# Patient Record
Sex: Female | Born: 1970 | Race: White | Hispanic: No | Marital: Married | Smoking: Never smoker
Health system: Southern US, Community
[De-identification: ages and names within clinical notes are randomized; demographics above are authoritative.]

## PROBLEM LIST (undated history)

## (undated) DIAGNOSIS — F419 Anxiety disorder, unspecified: Secondary | ICD-10-CM

## (undated) DIAGNOSIS — T7840XA Allergy, unspecified, initial encounter: Secondary | ICD-10-CM

## (undated) HISTORY — PX: COLONOSCOPY: SHX174

## (undated) HISTORY — DX: Anxiety disorder, unspecified: F41.9

## (undated) HISTORY — DX: Allergy, unspecified, initial encounter: T78.40XA

---

## 1989-01-02 HISTORY — PX: FOOT SURGERY: SHX648

## 2004-01-03 HISTORY — PX: OTHER SURGICAL HISTORY: SHX169

## 2014-01-02 DIAGNOSIS — F1910 Other psychoactive substance abuse, uncomplicated: Secondary | ICD-10-CM

## 2014-01-02 HISTORY — DX: Other psychoactive substance abuse, uncomplicated: F19.10

## 2019-09-16 ENCOUNTER — Encounter: Payer: Self-pay | Admitting: Gastroenterology

## 2019-11-19 ENCOUNTER — Encounter: Payer: Self-pay | Admitting: Gastroenterology

## 2020-04-27 ENCOUNTER — Encounter: Payer: Self-pay | Admitting: Gastroenterology

## 2020-06-24 ENCOUNTER — Ambulatory Visit (AMBULATORY_SURGERY_CENTER): Payer: Managed Care, Other (non HMO)

## 2020-06-24 ENCOUNTER — Other Ambulatory Visit: Payer: Self-pay

## 2020-06-24 VITALS — Ht 64.0 in | Wt 151.0 lb

## 2020-06-24 DIAGNOSIS — Z1211 Encounter for screening for malignant neoplasm of colon: Secondary | ICD-10-CM

## 2020-06-24 DIAGNOSIS — F1910 Other psychoactive substance abuse, uncomplicated: Secondary | ICD-10-CM | POA: Insufficient documentation

## 2020-06-24 MED ORDER — PEG 3350-KCL-NA BICARB-NACL 420 G PO SOLR: 4000.0000 mL | Freq: Once | ORAL | 0 refills | Status: AC

## 2020-06-24 NOTE — Progress Notes (Signed)

## 2020-07-07 ENCOUNTER — Encounter: Payer: Self-pay | Admitting: Gastroenterology

## 2020-07-08 ENCOUNTER — Encounter: Payer: Self-pay | Admitting: Gastroenterology

## 2020-07-08 ENCOUNTER — Other Ambulatory Visit: Payer: Self-pay

## 2020-07-08 ENCOUNTER — Ambulatory Visit (AMBULATORY_SURGERY_CENTER): Payer: Managed Care, Other (non HMO) | Admitting: Gastroenterology

## 2020-07-08 VITALS — BP 114/85 | HR 51 | Temp 98.9°F | Resp 13 | Ht 64.0 in | Wt 151.0 lb

## 2020-07-08 DIAGNOSIS — D123 Benign neoplasm of transverse colon: Secondary | ICD-10-CM | POA: Diagnosis not present

## 2020-07-08 DIAGNOSIS — D125 Benign neoplasm of sigmoid colon: Secondary | ICD-10-CM

## 2020-07-08 DIAGNOSIS — Z1211 Encounter for screening for malignant neoplasm of colon: Secondary | ICD-10-CM

## 2020-07-08 DIAGNOSIS — D122 Benign neoplasm of ascending colon: Secondary | ICD-10-CM

## 2020-07-08 MED ORDER — SODIUM CHLORIDE 0.9 % IV SOLN: 500.0000 mL | Freq: Once | INTRAVENOUS | Status: AC

## 2020-07-08 NOTE — Progress Notes (Signed)
Called to room to assist during endoscopic procedure.  Patient ID and intended procedure confirmed with present staff. Received instructions for my participation in the procedure from the performing physician.  

## 2020-07-08 NOTE — Progress Notes (Signed)
VS-CW  Pt's states no medical or surgical changes since previsit or office visit.  

## 2020-07-08 NOTE — Op Note (Signed)
[facility] Patient Name: Sheena Gonzales Procedure Date: 07/08/2020 8:26 AM MRN: [id number] Endoscopist: Thornton Park MD, MD Age: 51 Referring MD:  Date of Birth: [birth year] Gender: Female Account #: [id number] Procedure:                Colonoscopy Indications:              Screening for colorectal malignant neoplasm                           No known family history of colon cancer or polyps Medicines:                Monitored Anesthesia Care Procedure:                Pre-Anesthesia Assessment:                           - Prior to the procedure, a History and Physical                            was performed, and patient medications and                            allergies were reviewed. The patient's tolerance of                            previous anesthesia was also reviewed. The risks                            and benefits of the procedure and the sedation                            options and risks were discussed with the patient.                            All questions were answered, and informed consent                            was obtained. Prior Anticoagulants: The patient has                            taken no previous anticoagulant or antiplatelet                            agents. ASA Grade Assessment: II - A patient with                            mild systemic disease. After reviewing the risks                            and benefits, the patient was deemed in                            satisfactory condition to undergo the procedure.  After obtaining informed consent, the colonoscope                            was passed under direct vision. Throughout the                            procedure, the patient's blood pressure, pulse, and                            oxygen saturations were monitored continuously. The                            CF [id number] #[id number] was introduced through the anus                            and advanced  to the 3 cm into the ileum. A second                            forward view of the right colon was performed. The                            colonoscopy was performed with moderate difficulty                            due to a tortuous colon. Successful completion of                            the procedure was aided by applying abdominal                            pressure. The patient tolerated the procedure well.                            The quality of the bowel preparation was good. The                            terminal ileum, ileocecal valve, appendiceal                            orifice, and rectum were photographed. Scope In: 8:36:47 AM Scope Out: 8:75:64 AM Scope Withdrawal Time: 0 hours 10 minutes 40 seconds  Total Procedure Duration: 0 hours 19 minutes 45 seconds  Findings:                 The perianal and digital rectal examinations were                            normal.                           A 3 mm polyp was found in the sigmoid colon. The                            polyp was flat.  The polyp was removed with a cold                            snare. Resection and retrieval were complete.                            Estimated blood loss was minimal.                           A 2 mm polyp was found in the ascending colon. The                            polyp was sessile. The polyp was removed with a                            cold snare. Resection and retrieval were complete.                            Estimated blood loss was minimal.                           A 3 mm polyp was found in the transverse colon. The                            polyp was sessile. The polyp was removed with a                            cold snare. Resection and retrieval were complete.                            Estimated blood loss was minimal.                           The exam was otherwise without abnormality on                            direct and retroflexion views. Complications:             No immediate complications. Estimated blood loss:                            Minimal. Estimated Blood Loss:     Estimated blood loss was minimal. Impression:               - One 3 mm polyp in the sigmoid colon, removed with                            a cold snare. Resected and retrieved.                           - One 2 mm polyp in the ascending colon, removed                            with a cold snare. Resected  and retrieved.                           - One 3 mm polyp in the transverse colon, removed                            with a cold snare. Resected and retrieved.                           - The examination was otherwise normal on direct                            and retroflexion views. Recommendation:           - Patient has a contact number available for                            emergencies. The signs and symptoms of potential                            delayed complications were discussed with the                            patient. Return to normal activities tomorrow.                            Written discharge instructions were provided to the                            patient.                           - Resume previous diet.                           - Continue present medications.                           - Await pathology results.                           - Repeat colonoscopy date to be determined after                            pending pathology results are reviewed for                            surveillance.                           - Emerging evidence supports eating a diet of                            fruits, vegetables, grains, calcium, and yogurt                            while reducing red meat  and alcohol may reduce the                            risk of colon cancer.                           - Thank you for allowing me to be involved in your                            colon cancer prevention. Thornton Park MD, MD 07/08/2020 9:00:27  AM This report has been signed electronically.

## 2020-07-08 NOTE — Progress Notes (Signed)
pt tolerated well. VSS. awake and to recovery. Report given to RN.  

## 2020-07-08 NOTE — Patient Instructions (Signed)
Resume previous diet and medications Repeat colonoscopy date to be determined after pathology results are reviewed for surveillance.  YOU HAD AN ENDOSCOPIC PROCEDURE TODAY AT [facility]:   Refer to the procedure report that was given to you for any specific questions about what was found during the examination.  If the procedure report does not answer your questions, please call your gastroenterologist to clarify.  If you requested that your care partner not be given the details of your procedure findings, then the procedure report has been included in a sealed envelope for you to review at your convenience later.  YOU SHOULD EXPECT: Some feelings of bloating in the abdomen. Passage of more gas than usual.  Walking can help get rid of the air that was put into your GI tract during the procedure and reduce the bloating. If you had a lower endoscopy (such as a colonoscopy or flexible sigmoidoscopy) you may notice spotting of blood in your stool or on the toilet paper. If you underwent a bowel prep for your procedure, you may not have a normal bowel movement for a few days.  Please Note:  You might notice some irritation and congestion in your nose or some drainage.  This is from the oxygen used during your procedure.  There is no need for concern and it should clear up in a day or so.  SYMPTOMS TO REPORT IMMEDIATELY:  Following lower endoscopy (colonoscopy or flexible sigmoidoscopy):  Excessive amounts of blood in the stool  Significant tenderness or worsening of abdominal pains  Swelling of the abdomen that is new, acute  Fever of 100F or higher  For urgent or emergent issues, a gastroenterologist can be reached at any hour by calling [phone]. Do not use MyChart messaging for urgent concerns.    DIET:  We do recommend a small meal at first, but then you may proceed to your regular diet.  Drink plenty of fluids but you should avoid alcoholic beverages for 24  hours.  ACTIVITY:  You should plan to take it easy for the rest of today and you should NOT DRIVE or use heavy machinery until tomorrow (because of the sedation medicines used during the test).    FOLLOW UP: Our staff will call the number listed on your records 48-72 hours following your procedure to check on you and address any questions or concerns that you may have regarding the information given to you following your procedure. If we do not reach you, we will leave a message.  We will attempt to reach you two times.  During this call, we will ask if you have developed any symptoms of COVID 19. If you develop any symptoms (ie: fever, flu-like symptoms, shortness of breath, cough etc.) before then, please call [phone].  If you test positive for Covid 19 in the 2 weeks post procedure, please call and report this information to [place].    If any biopsies were taken you will be contacted by phone or by letter within the next 1-3 weeks.  Please call us at [phone] if you have not heard about the biopsies in 3 weeks.    SIGNATURES/CONFIDENTIALITY: You and/or your care partner have signed paperwork which will be entered into your electronic medical record.  These signatures attest to the fact that that the information above on your After Visit Summary has been reviewed and is understood.  Full responsibility of the confidentiality of this discharge information lies with you and/or your care-partner.

## 2020-07-12 ENCOUNTER — Telehealth: Payer: Self-pay

## 2020-07-12 NOTE — Telephone Encounter (Signed)
LVM

## 2020-07-15 ENCOUNTER — Encounter: Payer: Self-pay | Admitting: Gastroenterology

## 2020-09-17 ENCOUNTER — Other Ambulatory Visit: Payer: Self-pay | Admitting: Family Medicine

## 2020-09-17 DIAGNOSIS — Z1231 Encounter for screening mammogram for malignant neoplasm of breast: Secondary | ICD-10-CM

## 2020-11-04 ENCOUNTER — Ambulatory Visit
Admission: RE | Admit: 2020-11-04 | Discharge: 2020-11-04 | Disposition: A | Discharge status: Home / Self Care | Payer: Managed Care, Other (non HMO) | Source: Ambulatory Visit | Attending: Family Medicine | Admitting: Family Medicine

## 2020-11-04 DIAGNOSIS — Z1231 Encounter for screening mammogram for malignant neoplasm of breast: Secondary | ICD-10-CM

## 2021-01-07 NOTE — Progress Notes (Signed)
Sheena Gonzales D.[place] Phone: [phone]   Assessment and Plan:     1. Right knee pain, unspecified chronicity 2. 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  This lesion may be putting pressure on lateral quadriceps tendon and causing patient's discomfort - Recommend starting HEP for quadriceps stretching and strengthening - Use Voltaren gel topically if uncomfortable - Advised patient that we could trial corticosteroid injections directly into lipoma to cause fat atrophy and hopefully shrink the size of lipoma, or we could refer to general surgery for removal of lipoma at any time if patient wishes.  Patient does not wish to proceed with injection or surgical referral at this time, but will contact us if she does in the future  Sports Medicine: Musculoskeletal Ultrasound. Exam: Limited US of right knee Diagnosis: Soft tissue mass  US Findings: Well-circumscribed mass of right knee, superior lateral to patella, superficial, mobile, mixed, heterogeneous  US Impression:  Lipoma   Pertinent previous records reviewed include none   Follow Up: As needed if no improvement or worsening of symptoms.  Patient may also call back if she wishes to have corticosteroid injection or referral to surgery   Subjective:   I, Pincus Badder, am serving as a Education administrator for Doctor [employer]  Chief Complaint: right knee pain cyst  HPI:   01/10/2021 Patient is a 51 year old female complaining of right knee pain cyst. Patient states that she's had it for at least 10 years gets it check periodically, its starting to bother her so she would like to get it checked out, is an avid runner and would like to ram up distance and pace. Does stretch and it hurts to stretch due to cyst    Relevant Historical Information: None  pertinent  Additional pertinent review of systems negative.   Current Outpatient Medications:    buPROPion (WELLBUTRIN XL) 150 MG 24 hr tablet, Take 1 tablet by mouth every morning., Disp: , Rfl:    FLUoxetine (PROZAC) 20 MG capsule, Take 20 mg by mouth daily., Disp: , Rfl:    ibuprofen (ADVIL) 400 MG tablet, Take 400 mg by mouth every 6 (six) hours as needed., Disp: , Rfl:   Current Facility-Administered Medications:    0.9 %  sodium chloride infusion, 500 mL, Intravenous, Once, Thornton Park, MD   Objective:     Vitals:   01/10/21 0751  BP: 122/80  Pulse: 72  SpO2: 97%  Weight: 152 lb (68.9 kg)  Height: 5\' 4"  (1.626 m)      Body mass index is 26.09 kg/m.    Physical Exam:    General:  awake, alert oriented, no acute distress nontoxic Skin: Approximately 3 x 1.5 cm soft, mobile lesion of right knee, superior and lateral to patella without erythema, warmth.  No  rashes Neuro:sensation intact, no deficits, strength 5/5 with no deficits, no atrophy, normal muscle tone Psych: No signs of anxiety, depression or other mood disorder  Knee: No swelling No deformity Neg fluid wave, joint milking ROM Flex 110 , Ext 0  TTP mildly over distal, lateral quad tendon, over lesion site NTTP over the   medial fem condyle, lat fem condyle, patella, plica, patella tendon, tibial tuberostiy, fibular head, posterior fossa, pes anserine bursa, gerdy's tubercle, medial jt line, lateral jt line Neg anterior and posterior drawer Neg lachman Neg sag sign  Negative varus stress Negative valgus stress Negative McMurray Negative [place]  Gait normal    Electronically signed by:  Sheena Gonzales D.[facility] 8:19 AM 01/10/21

## 2021-01-10 ENCOUNTER — Ambulatory Visit: Payer: 59 | Admitting: Sports Medicine

## 2021-01-10 ENCOUNTER — Ambulatory Visit: Payer: Self-pay

## 2021-01-10 ENCOUNTER — Other Ambulatory Visit: Payer: Self-pay

## 2021-01-10 VITALS — BP 122/80 | HR 72 | Ht 64.0 in | Wt 152.0 lb

## 2021-01-10 DIAGNOSIS — D1723 Benign lipomatous neoplasm of skin and subcutaneous tissue of right leg: Secondary | ICD-10-CM | POA: Diagnosis not present

## 2021-01-10 DIAGNOSIS — M25561 Pain in right knee: Secondary | ICD-10-CM

## 2021-01-10 NOTE — Patient Instructions (Addendum)
Good to see you Quad HEP As needed follow up

## 2021-04-04 ENCOUNTER — Ambulatory Visit: Payer: 59 | Admitting: Sports Medicine

## 2021-04-04 VITALS — BP 110/78 | Ht 64.0 in | Wt 150.0 lb

## 2021-04-04 DIAGNOSIS — M9904 Segmental and somatic dysfunction of sacral region: Secondary | ICD-10-CM

## 2021-04-04 DIAGNOSIS — M9905 Segmental and somatic dysfunction of pelvic region: Secondary | ICD-10-CM

## 2021-04-04 DIAGNOSIS — M545 Low back pain, unspecified: Secondary | ICD-10-CM

## 2021-04-04 DIAGNOSIS — M9903 Segmental and somatic dysfunction of lumbar region: Secondary | ICD-10-CM

## 2021-04-04 MED ORDER — MELOXICAM 15 MG PO TABS: 15.0000 mg | ORAL_TABLET | Freq: Every day | ORAL | 0 refills | Status: DC

## 2021-04-04 NOTE — Patient Instructions (Addendum)
Good to see you  ?- Start meloxicam 15 mg daily x2 weeks.  If still having pain after 2 weeks, complete 3rd-week of meloxicam. May use remaining meloxicam as needed once daily for pain control.  Do not to use additional NSAIDs while taking meloxicam.  May use Tylenol [phone] mg 2 to 3 times a day for breakthrough pain. ?2-3 week follow up for repeat OMT ? ?

## 2021-04-04 NOTE — Progress Notes (Signed)
? ? Benito Mccreedy D.Merril Abbe ?[facility] ?[place] ?Phone: [phone] ?  ?Assessment and Plan:   ?  ?1. Acute left-sided low back pain without sciatica ?2. Somatic dysfunction of lumbar region ?3. Somatic dysfunction of pelvic region ?4. Somatic dysfunction of sacral region ?-Acute, uncomplicated, initial sports medicine visit ?- Most consistent with strain of left quadratus lumborum while mountain biking, resulting in somatic dysfunction ?- Start meloxicam 15 mg daily x2 weeks.  If still having pain after 2 weeks, complete 3rd-week of meloxicam. May use remaining meloxicam as needed once daily for pain control.  Do not to use additional NSAIDs while taking meloxicam.  May use Tylenol [phone] mg 2 to 3 times a day for breakthrough pain. ?- Patient elected for initial OMT today.  Tolerated well per note below. ?- Decision today to treat with OMT was based on Physical Exam ? ?After verbal consent patient was treated with HVLA (high velocity low amplitude), ME (muscle energy), FPR (flex positional release), ST (soft tissue), PC/PD (Pelvic Compression/ Pelvic Decompression) techniques in sacrum, lumbar, and pelvic areas. Patient tolerated the procedure well with improvement in symptoms.  Patient educated on potential side effects of soreness and recommended to rest, hydrate, and use Tylenol as needed for pain control.  ?  ?Pertinent previous records reviewed include none ?  ?Follow Up: 2 to 3 weeks for reevaluation.  Could repeat OMT if patient found today beneficial ?  ?Subjective:   ?I, Sheena Gonzales, am serving as a Education administrator for Doctor [employer] ? ?Chief Complaint: low back pain ? ?HPI:  ? ?04/04/21 ?Patient is a 51 year old female complaining of low back pain. Patient states that when she was mountain biking 3 weeks ago she hit a dip hard and felt a little tweak left side  was able to go about daily activities , has been rolling , stretching, has a  Physiological scientist, has been taking ib and that doesn't seem to be helping, pain stays on the left side but does radiate down to her left glute feels like a pinched nerve, but it feels like a different nerve than the sciatic, no numbness but when sitting she does get some tingling  ? ?Relevant Historical Information: None pertinent ? ?Additional pertinent review of systems negative. ? ? ?Current Outpatient Medications:  ?  buPROPion (WELLBUTRIN XL) 150 MG 24 hr tablet, Take 1 tablet by mouth every morning., Disp: , Rfl:  ?  FLUoxetine (PROZAC) 20 MG capsule, Take 20 mg by mouth daily., Disp: , Rfl:  ?  ibuprofen (ADVIL) 400 MG tablet, Take 400 mg by mouth every 6 (six) hours as needed., Disp: , Rfl:  ?  meloxicam (MOBIC) 15 MG tablet, Take 1 tablet (15 mg total) by mouth daily., Disp: 30 tablet, Rfl: 0 ? ?Current Facility-Administered Medications:  ?  0.9 %  sodium chloride infusion, 500 mL, Intravenous, Once, Thornton Park, MD  ? ?Objective:   ?  ?Vitals:  ? 04/04/21 1450  ?BP: 110/78  ?Weight: 150 lb (68 kg)  ?Height: '5\' 4"'$  (1.626 m)  ?  ?  ?Body mass index is 25.75 kg/m?.  ?  ?Physical Exam:   ? ?General: Well-appearing, cooperative, sitting comfortably in no acute distress.  ? ?OMT Physical Exam: ? ?ASIS Compression Test: Positive Right ?Sacrum: TTP left sacral base.  Positive sphinx ?Lumbar: TTP paraspinal, L2 RRSR, L5 rotated left ?Pelvis: Right anterior innominate ? ?Gen: Appears well, nad, nontoxic and pleasant ?Psych: Alert and  oriented, appropriate mood and affect ?Neuro: sensation intact, strength is 5/5 in upper and lower extremities, muscle tone wnl ?Skin: no susupicious lesions or rashes ? ?Back - Normal skin, Spine with normal alignment and no deformity.   ?No tenderness to vertebral process palpation.   ?Paraspinous muscles are mildly tender on left L2-L4 and without spasm ?TTP along left flank ?Straight leg raise negative ?Trendelenberg negative  ? ? ?Electronically signed by:  ?Benito Mccreedy D.Merril Abbe ?[facility] ?3:32 PM 04/04/21             ?

## 2021-04-15 NOTE — Progress Notes (Signed)
? Sheena Gonzales ?[facility] ?[place] ?Phone: [phone] ?  ?Assessment and Plan:   ?  ?1. Acute left-sided low back pain without sciatica ?2. Chronic bilateral thoracic back pain ?3. Somatic dysfunction of thoracic region ?4. Somatic dysfunction of lumbar region ?5. Somatic dysfunction of pelvic region ?6. Somatic dysfunction of rib region ?7. Somatic dysfunction of sacral region ?-Chronic with exacerbation, subsequent visit ?- Recurrence of multiple chronic musculoskeletal complaints with most prominent being in thoracic spine.  Overall significant relief of pain and acute left lumbar strain after conservative therapy with 2-week course of meloxicam, relative rest, HEP ?- Discontinue daily meloxicam and use remainder as needed ?- Start HEP for thoracic region ?- Patient has received significant relief with OMT in the past.  Elects for repeat OMT today.  Tolerated well per note below. ?- Decision today to treat with OMT was based on Physical Exam ?  ?After verbal consent patient was treated with HVLA (high velocity low amplitude), ME (muscle energy), FPR (flex positional release), ST (soft tissue), PC/PD (Pelvic Compression/ Pelvic Decompression) techniques in sacrum, rib, thoracic, lumbar, and pelvic areas. Patient tolerated the procedure well with improvement in symptoms.  Patient educated on potential side effects of soreness and recommended to rest, hydrate, and use Tylenol as needed for pain control. ?  ?Pertinent previous records reviewed include none ?  ?Follow Up: 2 to 3 weeks for repeat OMT maintenance.  Goal of spacing out treatments to 4 to 6 weeks if tolerated ?  ?Subjective:   ?I, Pincus Badder, am serving as a Education administrator for Doctor [employer] ?  ?Chief Complaint: low back pain ?  ?HPI:  ?  ?04/04/21 ?Patient is a 51 year old female complaining of low back pain. Patient states that when she was mountain biking 3 weeks ago she hit a dip hard and  felt a little tweak left side  was able to go about daily activities , has been rolling , stretching, has a Physiological scientist, has been taking ib and that doesn't seem to be helping, pain stays on the left side but does radiate down to her left glute feels like a pinched nerve, but it feels like a different nerve than the sciatic, no numbness but when sitting she does get some tingling  ? ?04/18/2021 ?Patient states that she's doing good her back feels better, hip is a little sore from riding her bike on the trails yesterday ? ? ?Relevant Historical Information: None pertinent ? ?Additional pertinent review of systems negative. ? ?Current Outpatient Medications  ?Medication Sig Dispense Refill  ? buPROPion (WELLBUTRIN XL) 150 MG 24 hr tablet Take 1 tablet by mouth every morning.    ? FLUoxetine (PROZAC) 20 MG capsule Take 20 mg by mouth daily.    ? ibuprofen (ADVIL) 400 MG tablet Take 400 mg by mouth every 6 (six) hours as needed.    ? meloxicam (MOBIC) 15 MG tablet Take 1 tablet (15 mg total) by mouth daily. 30 tablet 0  ? ?Current Facility-Administered Medications  ?Medication Dose Route Frequency Provider Last Rate Last Admin  ? 0.9 %  sodium chloride infusion  500 mL Intravenous Once Thornton Park, MD      ?  ?  ?Objective:   ?  ?Vitals:  ? 04/18/21 0828  ?BP: 110/78  ?Pulse: (!) 52  ?SpO2: 96%  ?Weight: 150 lb (68 kg)  ?Height: '5\' 4"'$  (1.626 m)  ?  ?  ?Body mass index  is 25.75 kg/m?.  ?  ?Physical Exam:   ?  ?General: Well-appearing, cooperative, sitting comfortably in no acute distress.  ? ?OMT Physical Exam: ? ?ASIS Compression Test: Positive Right ?Sacrum: NTTP bilateral sacral base.  Negative sphinx ?Rib: Right ribs 3-6 stuck in inhalation ?Thoracic: TTP paraspinal, T3-6 RRSL ?Lumbar: TTP paraspinal, L1-3 RRSL ?Pelvis: Right anterior innominate with out flare ? ?Electronically signed by:  ?Sheena Gonzales ?[facility] ?8:55 AM 04/18/21 ?

## 2021-04-18 ENCOUNTER — Ambulatory Visit: Payer: 59 | Admitting: Sports Medicine

## 2021-04-18 VITALS — BP 110/78 | HR 52 | Ht 64.0 in | Wt 150.0 lb

## 2021-04-18 DIAGNOSIS — G8929 Other chronic pain: Secondary | ICD-10-CM

## 2021-04-18 DIAGNOSIS — M9902 Segmental and somatic dysfunction of thoracic region: Secondary | ICD-10-CM

## 2021-04-18 DIAGNOSIS — M546 Pain in thoracic spine: Secondary | ICD-10-CM | POA: Diagnosis not present

## 2021-04-18 DIAGNOSIS — M9908 Segmental and somatic dysfunction of rib cage: Secondary | ICD-10-CM

## 2021-04-18 DIAGNOSIS — M9904 Segmental and somatic dysfunction of sacral region: Secondary | ICD-10-CM

## 2021-04-18 DIAGNOSIS — M9903 Segmental and somatic dysfunction of lumbar region: Secondary | ICD-10-CM | POA: Diagnosis not present

## 2021-04-18 DIAGNOSIS — M545 Low back pain, unspecified: Secondary | ICD-10-CM | POA: Diagnosis not present

## 2021-04-18 DIAGNOSIS — M9905 Segmental and somatic dysfunction of pelvic region: Secondary | ICD-10-CM | POA: Diagnosis not present

## 2021-04-18 NOTE — Patient Instructions (Addendum)
Good to see you  ?2-3 week OMT follow up  ?

## 2021-05-01 ENCOUNTER — Other Ambulatory Visit: Payer: Self-pay | Admitting: Sports Medicine
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
## 2021-05-06 ENCOUNTER — Ambulatory Visit: Payer: 59 | Admitting: Sports Medicine

## 2021-05-06 VITALS — BP 120/80 | HR 57 | Ht 64.0 in | Wt 150.0 lb

## 2021-05-06 DIAGNOSIS — M9903 Segmental and somatic dysfunction of lumbar region: Secondary | ICD-10-CM

## 2021-05-06 DIAGNOSIS — G8929 Other chronic pain: Secondary | ICD-10-CM | POA: Diagnosis not present

## 2021-05-06 DIAGNOSIS — M9901 Segmental and somatic dysfunction of cervical region: Secondary | ICD-10-CM

## 2021-05-06 DIAGNOSIS — M9905 Segmental and somatic dysfunction of pelvic region: Secondary | ICD-10-CM

## 2021-05-06 DIAGNOSIS — M9902 Segmental and somatic dysfunction of thoracic region: Secondary | ICD-10-CM | POA: Diagnosis not present

## 2021-05-06 DIAGNOSIS — M546 Pain in thoracic spine: Secondary | ICD-10-CM | POA: Diagnosis not present

## 2021-05-06 DIAGNOSIS — M9908 Segmental and somatic dysfunction of rib cage: Secondary | ICD-10-CM

## 2021-05-06 NOTE — Patient Instructions (Addendum)
Good to see you  ?Shoulder HEP  ?Follow up next week to discuss shoulder ?

## 2021-05-12 NOTE — Progress Notes (Signed)
? ? Sheena Gonzales D.Merril Abbe ?[facility] ?[place] ?Phone: [phone] ?  ?Assessment and Plan:   ?  ?1. Acute pain of left shoulder ?2. Arthralgia of left acromioclavicular joint ?-Acute, uncomplicated, initial sports medicine visit ?- Consistent with flare of pain at [facility] joint likely due to physical activity versus side sleeping that has not improved with relative rest and NSAIDs p.o. ?- Patient elected for [facility] CSI.  Tolerated well per note below ?- May restart upper extremity physical activity in 2 to 3 days as tolerated.  Advised to gradually increase from 50% to 75% to 100% with goal of pain-free activity ? ?Sports Medicine: Musculoskeletal Ultrasound. ?Procedure: Ultrasound Guided Acromioclavicular Joint Injection/Aspiration ?Side: Left ?Diagnosis: AC joint pain ?[place] Indication:  ?- accuracy is paramount for diagnosis ?- to ensure therapeutic efficacy or procedural success ?- to reduce procedural risk ? ?After explaining the procedure, viable alternatives, risks, and answering any questions, consent was given verbally. The site was cleaned with chlorhexidine prep. An ultrasound transducer was placed on the shoulder.  The acromioclavicular joint was identified.  The capsule is intact.   A steroid injection was performed under ultrasound guidance with sterile technique using  1 ml of 1% lidocaine without epinephrine and 40 mg of triamcinolone (KENALOG) '40mg'$ /ml. This was well tolerated and resulted in relief.  Needle was removed and dressing placed and post injection instructions were given including  a discussion of likely return of pain today after the anesthetic wears off (with the possibility of worsened pain) until the steroid starts to work in 1-3 days.   Pt was advised to call or return to clinic if these symptoms worsen or fail to improve as anticipated. ? ?  ?Pertinent previous records reviewed include none ?  ?Follow Up: 3 to 4 weeks for reevaluation.   Could consider ultrasound versus PT if no improvement or worsening of symptoms.  Could also repeat OMT at that time ?  ?Subjective:   ?I, Sheena Gonzales, am serving as a Education administrator for Doctor [employer] ? ?Chief Complaint: left shoulder pain  ? ?HPI:  ?05/13/2021 ?Patient is a 51 year old female complaining of left  shoulder pain. Patient states that she is TTP and when driving with one arm she can feel it the most , has been going on for about 4 weeks no MOI, no numbness or tingling, pain on top of shoulder no radiating pain, has been taking meloxicam has been guarding denies any popping locking or clicking  ? ? ?Relevant Historical Information: None pertinent ? ?Additional pertinent review of systems negative. ? ? ?Current Outpatient Medications:  ?  buPROPion (WELLBUTRIN XL) 150 MG 24 hr tablet, Take 1 tablet by mouth every morning., Disp: , Rfl:  ?  FLUoxetine (PROZAC) 20 MG capsule, Take 20 mg by mouth daily., Disp: , Rfl:  ?  ibuprofen (ADVIL) 400 MG tablet, Take 400 mg by mouth every 6 (six) hours as needed., Disp: , Rfl:  ?  meloxicam (MOBIC) 15 MG tablet, Take 1 tablet (15 mg total) by mouth daily., Disp: 30 tablet, Rfl: 0 ? ?Current Facility-Administered Medications:  ?  0.9 %  sodium chloride infusion, 500 mL, Intravenous, Once, Thornton Park, MD  ? ?Objective:   ?  ?Vitals:  ? 05/13/21 0812  ?BP: 110/78  ?Pulse: (!) 53  ?SpO2: 99%  ?Weight: 150 lb (68 kg)  ?Height: '5\' 4"'$  (1.626 m)  ?  ?  ?Body mass index is 25.75 kg/m?Marland Kitchen  ?  ?  Physical Exam:   ? ?Gen: Appears well, nad, nontoxic and pleasant ?Neuro:sensation intact, strength is 5/5 with df/pf/inv/ev, muscle tone wnl ?Skin: no suspicious lesion or defmority ?Psych: A&O, appropriate mood and affect ? ?Left shoulder: no deformity, swelling or muscle wasting ?No scapular winging ?FF 180, abd 180, int 0, ext 90 ?TTP AC ?NTTP over the [place], clavicle,  coracoid, biceps groove, humerus, deltoid, trapezius, cervical spine ?Neg neer, hawkings, empty can, subscap  liftoff, speeds, obriens,   ?Positive crossarm ?Neg ant drawer, sulcus sign, apprehension ?Negative Spurling's test bilat ?FROM of neck  ? ? ?Electronically signed by:  ?Sheena Gonzales D.Merril Abbe ?[facility] ?8:56 AM 05/13/21 ?

## 2021-05-13 ENCOUNTER — Ambulatory Visit: Payer: Self-pay

## 2021-05-13 ENCOUNTER — Ambulatory Visit: Payer: 59 | Admitting: Sports Medicine

## 2021-05-13 ENCOUNTER — Ambulatory Visit (INDEPENDENT_AMBULATORY_CARE_PROVIDER_SITE_OTHER): Payer: 59

## 2021-05-13 VITALS — BP 110/78 | HR 53 | Ht 64.0 in | Wt 150.0 lb

## 2021-05-13 DIAGNOSIS — M25512 Pain in left shoulder: Secondary | ICD-10-CM

## 2021-05-13 NOTE — Patient Instructions (Addendum)
Good to see you ?Recommend starting upper extremity activities  in 2-3 days  ?Slowly progress from 50% to 75% to 100% ?3 week follow up  ?

## 2021-05-16 ENCOUNTER — Ambulatory Visit (INDEPENDENT_AMBULATORY_CARE_PROVIDER_SITE_OTHER): Payer: 59

## 2021-05-16 ENCOUNTER — Ambulatory Visit: Payer: Self-pay

## 2021-05-16 ENCOUNTER — Ambulatory Visit: Payer: 59 | Admitting: Sports Medicine

## 2021-05-16 VITALS — BP 110/78 | Ht 64.0 in | Wt 149.0 lb

## 2021-05-16 DIAGNOSIS — M25511 Pain in right shoulder: Secondary | ICD-10-CM

## 2021-05-16 DIAGNOSIS — M7541 Impingement syndrome of right shoulder: Secondary | ICD-10-CM
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
## 2021-05-16 NOTE — Patient Instructions (Addendum)
Good to see you  ?Shoulder HEP ?Restart - Start meloxicam 15 mg daily x2 weeks.    Do not to use additional NSAIDs while taking meloxicam.  May use Tylenol [phone] mg 2 to 3 times a day for breakthrough pain. ?If you run out call us and we can refill ?Keep your follow up appointment  ?
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
## 2021-06-03 ENCOUNTER — Ambulatory Visit: Payer: 59 | Admitting: Sports Medicine

## 2021-06-03 VITALS — BP 110/80 | HR 61 | Ht 64.0 in | Wt 149.0 lb

## 2021-06-03 DIAGNOSIS — M7541 Impingement syndrome of right shoulder: Secondary | ICD-10-CM | POA: Diagnosis not present

## 2021-06-03 DIAGNOSIS — M25511 Pain in right shoulder: Secondary | ICD-10-CM

## 2021-06-03 MED ORDER — MELOXICAM 15 MG PO TABS
15.0000 mg | ORAL_TABLET | Freq: Every day | ORAL | 0 refills | Status: DC
Start: 2021-06-03 — End: 2022-02-15

## 2021-06-03 NOTE — Patient Instructions (Signed)
Good to see you   

## 2021-07-05 ENCOUNTER — Other Ambulatory Visit: Payer: Self-pay | Admitting: Sports Medicine
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
## 2021-07-14 ENCOUNTER — Ambulatory Visit: Payer: 59 | Admitting: Sports Medicine

## 2021-07-14 VITALS — BP 120/80 | Ht 64.0 in | Wt 150.0 lb

## 2021-07-14 DIAGNOSIS — M25512 Pain in left shoulder: Secondary | ICD-10-CM

## 2021-07-14 DIAGNOSIS — G8929 Other chronic pain: Secondary | ICD-10-CM

## 2021-07-14 NOTE — Patient Instructions (Addendum)
Good to see you  Mri referral  Avoid exercises like push up  - Start meloxicam 15 mg daily x2 weeks.  If still having pain after 2 weeks, complete 3rd-week of meloxicam. May use remaining meloxicam as needed once daily for pain control.  Do not to use additional NSAIDs while taking meloxicam.  May use Tylenol [phone] mg 2 to 3 times a day for breakthrough pain. Follow up 3 days after MRI to discuss results

## 2021-07-24 ENCOUNTER — Ambulatory Visit
Admission: RE | Admit: 2021-07-24 | Discharge: 2021-07-24 | Disposition: A | Discharge status: Home / Self Care | Payer: 59 | Source: Ambulatory Visit | Attending: Sports Medicine | Admitting: Sports Medicine

## 2021-07-24 DIAGNOSIS — G8929 Other chronic pain: Secondary | ICD-10-CM

## 2021-07-24 DIAGNOSIS — M25512 Pain in left shoulder: Secondary | ICD-10-CM
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
## 2021-07-27 ENCOUNTER — Ambulatory Visit (INDEPENDENT_AMBULATORY_CARE_PROVIDER_SITE_OTHER): Payer: 59 | Admitting: Sports Medicine

## 2021-07-27 DIAGNOSIS — M25512 Pain in left shoulder: Secondary | ICD-10-CM | POA: Diagnosis not present

## 2021-07-27 DIAGNOSIS — G8929 Other chronic pain: Secondary | ICD-10-CM

## 2021-07-27 DIAGNOSIS — M7552 Bursitis of left shoulder: Secondary | ICD-10-CM | POA: Diagnosis not present

## 2021-07-27 DIAGNOSIS — M75112 Incomplete rotator cuff tear or rupture of left shoulder, not specified as traumatic: Secondary | ICD-10-CM
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
## 2021-08-23 ENCOUNTER — Ambulatory Visit: Payer: 59 | Admitting: Sports Medicine

## 2021-08-23 VITALS — BP 118/80 | HR 57 | Ht 64.0 in | Wt 151.0 lb

## 2021-08-23 DIAGNOSIS — M7552 Bursitis of left shoulder: Secondary | ICD-10-CM

## 2021-08-23 DIAGNOSIS — G8929 Other chronic pain: Secondary | ICD-10-CM | POA: Diagnosis not present

## 2021-08-23 DIAGNOSIS — M75112 Incomplete rotator cuff tear or rupture of left shoulder, not specified as traumatic: Secondary | ICD-10-CM

## 2021-08-23 DIAGNOSIS — M25512 Pain in left shoulder: Secondary | ICD-10-CM | POA: Diagnosis not present

## 2021-08-23 NOTE — Patient Instructions (Addendum)
Good to see you   PT referral  1 month follow up

## 2021-09-12 NOTE — Therapy (Unsigned)
OUTPATIENT PHYSICAL THERAPY SHOULDER EVALUATION   Patient Name: Sheena Gonzales MRN: [id number] DOB:08-26-1970, 51 y.o., female Today's Date: 09/13/2021   PT End of Session - 09/13/21 0856     Visit Number 1    Number of Visits 12    Date for PT Re-Evaluation 11/08/21    Authorization Type UHC    PT Start Time 0845    PT Stop Time 0930    PT Time Calculation (min) 45 min    Activity Tolerance Patient tolerated treatment well    Behavior During Therapy [facility] for tasks assessed/performed             Past Medical History:  Diagnosis Date   Allergy    Anxiety    Substance abuse ([place]) 2016   AA   Past Surgical History:  Procedure Laterality Date   ablation  2006   uterine   COLONOSCOPY     FOOT SURGERY Right 1991   Patient Active Problem List   Diagnosis Date Noted   Substance abuse ([place]) 06/24/2020    PCP: unknown  REFERRING PROVIDER: Glennon Mac, DO  REFERRING DIAG: [phone] (ICD-10-CM) - Nontraumatic incomplete tear of left rotator cuff M75.52 (ICD-10-CM) - Subacromial bursitis of left shoulder joint M25.512,G89.29 (ICD-10-CM) - Chronic left shoulder pain      THERAPY DIAG:  Chronic left shoulder pain  Abnormal posture  Rationale for Evaluation and Treatment Rehabilitation  ONSET DATE: chronic , <1 yr   SUBJECTIVE:                                                                                                                                                                                      SUBJECTIVE STATEMENT: Pt went in for assessment of a L shoulder "nodule" on the clavicle.   She had an injection which helped only briefly.  She had another injection.  She is active, biking, weights and Production designer, theatre/television/film. She still has difficulty with lifting bike up onto the rack, doing her normal fitness routine.  She has pain with lying on her L side. She notices a weakness when driving, turning the wheel.  She would like to be able to have full function of her L shoulder.    PERTINENT HISTORY: None relevant    PAIN:  Are you having pain? Yes: NPRS scale: minimal /10 Pain location: L shoulder  Pain description: uncomfortable, tender when palpated   Aggravating factors: extremes of ROM, lifting , push ups  Relieving factors: rest , non use   PRECAUTIONS: None  WEIGHT BEARING RESTRICTIONS No  FALLS:  Has patient fallen in last 6 months? 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  Strength , avoid surgery.   OBJECTIVE:   DIAGNOSTIC FINDINGS:  IMPRESSION: 1. Moderate rotator cuff tendinopathy/tendinosis most notably involving the supraspinatus tendon. There are partial-thickness articular and bursal surface tears as detailed above. No full-thickness retracted tear. 2. Intact long head biceps tendon and glenoid labrum. 3. Moderate AC joint degenerative changes but no other significant findings for bony impingement. 4. Mild subacromial/subdeltoid bursitis.      PATIENT SURVEYS:  FOTO 59% goal is 73 %   COGNITION:  Overall cognitive status: Within functional limits for tasks assessed     SENSATION: WFL  POSTURE: Slightly rounded shoulders with glenohumeral head more anterior L shoulder with goal post position on the wall.  Scapula abducted from midline on L side. Bilateral she has anterior seated humeral head.   UPPER EXTREMITY ROM:      Rt UE was more stiff than Lt. She fell on Rt UE awhile ago but no pain in Rt UE   Active ROM Right eval Left eval  Shoulder flexion WFL WFL, 160 no pain   Shoulder extension    Shoulder abduction WFL WFL,  160 no pain   Shoulder adduction    Shoulder internal rotation FR to T8 FR to T6  Shoulder external rotation FR  to T1 FR to T2  Elbow flexion    Elbow extension    Wrist flexion    Wrist extension    Wrist ulnar deviation    Wrist radial deviation    Wrist pronation    Wrist supination    (Blank rows = not tested)  UPPER EXTREMITY MMT:  MMT Right eval Left eval  Shoulder flexion 5 5  Shoulder extension    Shoulder abduction 5 5  Shoulder adduction    Shoulder internal rotation 5 5  Shoulder external rotation 5 4+  Middle trapezius 4+ 4  Lower trapezius    Elbow flexion    Elbow extension    Wrist flexion    Wrist extension    Wrist ulnar deviation    Wrist radial deviation    Wrist pronation    Wrist supination    Grip strength (lbs) 56 lbs 57.6 lbs  (Blank rows = not tested)  SHOULDER SPECIAL TESTS:  Impingement tests: Hawkins/Kennedy impingement test: negative and Painful arc test: negative  SLAP lesions:  NT  Instability tests: Apprehension test: negative   JOINT MOBILITY TESTING:  No stiffness in joint Post capsule tight L> R   PALPATION:  TTP   TODAY'S TREATMENT:  PT eval and HEP initiated   PATIENT EDUCATION: Education details: HEP, eval findings, PT process Person educated: Patient Education method: Explanation, Demonstration, Verbal cues, and Handouts Education comprehension: verbalized understanding and needs further education   HOME EXERCISE PROGRAM: Access Code: [email or web address] URL: [email or web address] Date: 09/13/2021 Prepared by: Raeford Razor  Exercises - Sleeper Stretch  - 1 x daily - 7 x weekly - 1 sets - 10 reps - 15 hold - Shoulder External Rotation and Scapular Retraction with Resistance  - 2 x daily - 7 x weekly - 3 sets - 10 reps - Standing Low Trap Setting with Resistance at [place] 2 x daily - 7 x weekly - 2 sets - 10 reps -  5 hold - Seated High Shoulder Row with Anchored Resistance  - 2 x daily - 7 x weekly - 2 sets - 10 reps - 5 hold  ASSESSMENT:  CLINICAL IMPRESSION: Patient is a 51 y.o. female who was seen today for  physical therapy evaluation and treatment for chronic L shoulder pain due to    OBJECTIVE IMPAIRMENTS decreased strength, increased fascial restrictions, impaired flexibility, impaired UE functional use, postural dysfunction, and pain.   ACTIVITY LIMITATIONS carrying, lifting, sleeping, and reach over head  PARTICIPATION LIMITATIONS: community activity and occupation  PERSONAL FACTORS Time since onset of injury/illness/exacerbation are also affecting patient's functional outcome.   REHAB POTENTIAL: Excellent  CLINICAL DECISION MAKING: Stable/uncomplicated  EVALUATION COMPLEXITY: Low   GOALS: Goals reviewed with patient? Yes  SHORT TERM GOALS: Target date: 10/11/2021  (Remove Blue Hyperlink)  Pt will be I with HEP for bilateral UE strength and mobility  Baseline:given on eval  Goal status: INITIAL  2.  Pt will be able to sleep/lie on L shoulder without disturbance of pain for short periods of time  Baseline:  Goal status: INITIAL  3.  Pt will lift light items (< 25 lbs) overhead (mimicking bike lift) with no pain to impact long term UE function  Baseline:  Goal status: INITIAL    LONG TERM GOALS: Target date: 11/08/2021  (Remove Blue Hyperlink)  Pt will be able to improve FOTO score to 70% or better  Baseline: 59% Goal status: INITIAL  2.  Pt will be able to lift bike onto her rack with confidence and no pain increase > 75% of the time  Baseline:  Goal status: INITIAL  3.  Pt will be able to perform a modified push up (on knees) without increased shoulder pain  Baseline: popping, pain with full push up  Goal status: INITIAL  4.  Pt will be I with final HEP upon discharge  Baseline:  Goal status: INITIAL    PLAN: PT FREQUENCY: 2x/week x 3 weeks then 1 x for 3 more weeks   PT DURATION: 6 weeks   PLANNED INTERVENTIONS: Therapeutic exercises, Therapeutic activity, Neuromuscular re-education, Balance training, Gait training, Patient/Family education, Self Care,  Joint mobilization, Dry Needling, Cryotherapy, Moist heat, Taping, Ultrasound, Ionotophoresis '4mg'$ /ml Dexamethasone, Manual therapy, and Re-evaluation  PLAN FOR NEXT SESSION: check HEP, UBE, scapular stab.  Check scap in closed chain. Modalities as needed    Rishit Burkhalter, PT 09/13/2021, 10:48 AM  Raeford Razor, PT 09/13/21 12:07 PM Phone: [phone] Fax: [phone]

## 2021-09-13 ENCOUNTER — Ambulatory Visit: Payer: 59 | Attending: Family Medicine | Admitting: Physical Therapy

## 2021-09-13 DIAGNOSIS — G8929 Other chronic pain: Secondary | ICD-10-CM | POA: Diagnosis present

## 2021-09-13 DIAGNOSIS — M25512 Pain in left shoulder: Secondary | ICD-10-CM | POA: Diagnosis present

## 2021-09-13 DIAGNOSIS — R293 Abnormal posture: Secondary | ICD-10-CM | POA: Insufficient documentation

## 2021-09-13 DIAGNOSIS — M75112 Incomplete rotator cuff tear or rupture of left shoulder, not specified as traumatic: Secondary | ICD-10-CM | POA: Insufficient documentation

## 2021-09-13 DIAGNOSIS — M7552 Bursitis of left shoulder: Secondary | ICD-10-CM | POA: Diagnosis not present
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
## 2021-09-20 ENCOUNTER — Ambulatory Visit: Payer: 59 | Admitting: Physical Therapy

## 2021-09-20 ENCOUNTER — Encounter: Payer: Self-pay | Admitting: Physical Therapy

## 2021-09-20 DIAGNOSIS — G8929 Other chronic pain: Secondary | ICD-10-CM

## 2021-09-20 DIAGNOSIS — M25512 Pain in left shoulder: Secondary | ICD-10-CM | POA: Diagnosis not present

## 2021-09-20 DIAGNOSIS — R293 Abnormal posture: Secondary | ICD-10-CM
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
## 2021-09-23 ENCOUNTER — Ambulatory Visit: Payer: 59 | Admitting: Physical Therapy

## 2021-09-23 ENCOUNTER — Encounter: Payer: Self-pay | Admitting: Physical Therapy

## 2021-09-23 ENCOUNTER — Ambulatory Visit: Payer: 59 | Admitting: Sports Medicine

## 2021-09-23 VITALS — BP 122/80 | HR 65 | Ht 64.0 in | Wt 145.0 lb

## 2021-09-23 DIAGNOSIS — M25512 Pain in left shoulder: Secondary | ICD-10-CM | POA: Diagnosis not present

## 2021-09-23 DIAGNOSIS — G8929 Other chronic pain: Secondary | ICD-10-CM

## 2021-09-23 DIAGNOSIS — M7552 Bursitis of left shoulder: Secondary | ICD-10-CM

## 2021-09-23 DIAGNOSIS — M75112 Incomplete rotator cuff tear or rupture of left shoulder, not specified as traumatic: Secondary | ICD-10-CM

## 2021-09-23 DIAGNOSIS — R293 Abnormal posture: Secondary | ICD-10-CM

## 2021-09-23 NOTE — Patient Instructions (Signed)
Good to see you   

## 2021-09-23 NOTE — Therapy (Unsigned)
OUTPATIENT PHYSICAL THERAPY TREATMENT NOTE   Patient Name: Sheena Gonzales MRN: [id number] DOB:June 04, 1970, 51 y.o., female Today's Date: 09/26/2021  PCP/REFERRING PROVIDER: Rachell Cipro, MD  END OF SESSION:   PT End of Session - 09/26/21 0848     Visit Number 4    Number of Visits 12    Date for PT Re-Evaluation 11/08/21    Authorization Type U[employer]    PT Start Time [phone]    PT Stop Time 0930    PT Time Calculation (min) 44 min    Activity Tolerance Patient tolerated treatment well    Behavior During Therapy [facility] for tasks assessed/performed              Past Medical History:  Diagnosis Date   Allergy    Anxiety    Substance abuse ([place]) 2016   AA   Past Surgical History:  Procedure Laterality Date   ablation  2006   uterine   COLONOSCOPY     FOOT SURGERY Right 1991   Patient Active Problem List   Diagnosis Date Noted   Substance abuse ([place]) 06/24/2020    REFERRING DIAG: Shoulder pain   THERAPY DIAG:  Chronic left shoulder pain  Abnormal posture  Rationale for Evaluation and Treatment Rehabilitation  PERTINENT HISTORY: see above   PRECAUTIONS: none   SUBJECTIVE: I was really sore yesterday. Used ice and that really helped.   PAIN:  Are you having pain? Yes: NPRS scale: 0/10 Pain location: L shoulder  Pain description: sore Aggravating factors: reaching back  Relieving factors: rest  Pain mostly when reaching back  OBJECTIVE: (objective measures completed at initial evaluation unless otherwise dated)  DIAGNOSTIC FINDINGS:  IMPRESSION: 1. Moderate rotator cuff tendinopathy/tendinosis most notably involving the supraspinatus tendon. There are partial-thickness articular and bursal surface tears as detailed above. No full-thickness retracted tear. 2. Intact long head biceps tendon and glenoid labrum. 3. Moderate AC joint degenerative changes but no other significant findings for bony impingement. 4. Mild subacromial/subdeltoid bursitis.        PATIENT SURVEYS:  FOTO 59% goal is 73 %    COGNITION:           Overall cognitive status: Within functional limits for tasks assessed                                  SENSATION: WFL   POSTURE: Slightly rounded shoulders with glenohumeral head more anterior L shoulder with goal post position on the wall.  Scapula abducted from midline on L side. Bilateral she has anterior seated humeral head.    UPPER EXTREMITY ROM:                           Rt UE was more stiff than Lt. She fell on Rt UE awhile ago but no pain in Rt UE     Active ROM Right eval Left eval  Shoulder flexion [facility] WFL, 160 no pain   Shoulder extension      Shoulder abduction WFL WFL,  160 no pain   Shoulder adduction      Shoulder internal rotation FR to T8 FR to T6  Shoulder external rotation FR to T1 FR to T2  Elbow flexion      Elbow extension      Wrist flexion      Wrist extension      Wrist ulnar deviation  Wrist radial deviation      Wrist pronation      Wrist supination      (Blank rows = not tested)   UPPER EXTREMITY MMT:   MMT Right eval Left eval  Shoulder flexion 5 5  Shoulder extension      Shoulder abduction 5 5  Shoulder adduction      Shoulder internal rotation 5 5  Shoulder external rotation 5 4+  Middle trapezius 4+ 4  Lower trapezius      Elbow flexion      Elbow extension      Wrist flexion      Wrist extension      Wrist ulnar deviation      Wrist radial deviation      Wrist pronation      Wrist supination      Grip strength (lbs) 56 lbs 57.6 lbs  (Blank rows = not tested)   SHOULDER SPECIAL TESTS:            Impingement tests: Hawkins/Kennedy impingement test: negative and Painful arc test: negative            SLAP lesions:  NT            Instability tests: Apprehension test: negative             JOINT MOBILITY TESTING:  No stiffness in joint Post capsule tight L> R       [facility] Adult PT Treatment:                                                DATE:  09/26/21 Therapeutic Exercise: Foam roller on the wall: overhead mobility, star pattern and horizontal pull with green band x 20 reps each Looped band overhead lift x 10 then chest press (scapular stabilizers) x 10  Facing wall, rolling up and down for lat release and serratus   Reformer Quadruped 1 red press out to plank x 10 , min cues for alignment in head neck and shoulders  Row 1 red roll down  x 10  Horizontal abd 1 red  x 10  ER/IR 1 red spring x 15  Supine arm arcs 1 red 1 yellow x 10 in parallel and then in abduction              [facility] Adult PT Treatment:                                                DATE: 09/23/21 Therapeutic Exercise: UBE level 1 , 5 min (2.5 min each direction)  Standing bands (green and black) -progressed to Free motion 20# low row 15 x 2  Free motion ext 14# bil shoulder ext  15 x 2  Free Motion high row 6# 15 x 2  Standing horizontal abduction with star Pattern 7 reps x 2  Bilat shoulder ER green band x 30, x 25 Sidelying post delt fly x 15 , 4 lbs  Sidelying ER , 4 lbs x 15  Sidelying abduction x 15 , 4 lbs  Sleeper stretch each side 10 sec x 5  POE plank 10 sec    [facility] Adult PT Treatment:  DATE: 09/20/21 Therapeutic Exercise: UBE level 1 , 5 min (2.5 min each direction)  Standing bands (green and black)  ER each side x 10 High row x 20  Horiztonal abd green x 15 Scarecrow with ER x 15  Green band ER walk outs  Sleeper stretch each side 10 sec x 5  Sidelying post delt fly x 15 , 4 lbs  Sidelying ER , 4 lbs x 15  Sidelying abduction x 15 , 4 lbs  Manual Therapy: L shoulder PROM, soft tissue to L supraspinatus, upper trap, levator scap and rhomboid, ant/mid delt.    PATIENT EDUCATION: Education details: HEP, eval findings, PT process Person educated: Patient Education method: Explanation, Demonstration, Verbal cues, and Handouts Education comprehension: verbalized understanding and needs further  education     HOME EXERCISE PROGRAM: Access Code: [email or web address] URL: [email or web address] Date: 09/20/2021 Prepared by: Raeford Razor  Exercises - Sleeper Stretch  - 1 x daily - 7 x weekly - 1 sets - 10 reps - 15 hold - Shoulder External Rotation and Scapular Retraction with Resistance  - 2 x daily - 7 x weekly - 3 sets - 10 reps - Standing Low Trap Setting with Resistance at [place]  - 2 x daily - 7 x weekly - 2 sets - 10 reps - 5 hold - Seated High Shoulder Row with Anchored Resistance  - 2 x daily - 7 x weekly - 2 sets - 10 reps - 5 hold - Sidelying Shoulder ER with Towel and Dumbbell  - 1 x daily - 7 x weekly - 2 sets - 10 reps - 5 hold - Sidelying Shoulder Abduction Palm Forward  - 1 x daily - 7 x weekly - 2 sets - 10 reps - 30 hold  ASSESSMENT:   CLINICAL IMPRESSION: Patient fatigued after session but no increase in shoulder pain .  Used Pilates Reformer for more full body strength and postural work. Weakness evident with core and seated UE work, thoracic extension. Pt may be interested in Pilates post PT.     OBJECTIVE IMPAIRMENTS decreased strength, increased fascial restrictions, impaired flexibility, impaired UE functional use, postural dysfunction, and pain.    ACTIVITY LIMITATIONS carrying, lifting, sleeping, and reach over head   PARTICIPATION LIMITATIONS: community activity and occupation   PERSONAL FACTORS Time since onset of injury/illness/exacerbation are also affecting patient's functional outcome.    REHAB POTENTIAL: Excellent   CLINICAL DECISION MAKING: Stable/uncomplicated   EVALUATION COMPLEXITY: Low     GOALS: Goals reviewed with patient? Yes   SHORT TERM GOALS: Target date: 10/11/2021  (Remove Blue Hyperlink)   Pt will be I with HEP for bilateral UE strength and mobility  Baseline:given on eval  Goal status: met    2.  Pt will be able to sleep/lie on L shoulder without disturbance of pain for short periods of time  Baseline:  Status:  09/23/21: pain has not been waking her this week Goal status: met   3.  Pt will lift light items (< 25 lbs) overhead (mimicking bike lift) with no pain to impact long term UE function  Baseline:  Goal status: ONGOING       LONG TERM GOALS: Target date: 11/08/2021  (Remove Blue Hyperlink)   Pt will be able to improve FOTO score to 70% or better  Baseline: 59% Goal status: INITIAL   2.  Pt will be able to lift bike onto her rack with confidence and no pain increase > 75% of the time  Baseline:  Goal status: INITIAL   3.  Pt will be able to perform a modified push up (on knees) without increased shoulder pain  Baseline: popping, pain with full push up  Goal status: INITIAL   4.  Pt will be I with final HEP upon discharge  Baseline:  Goal status: INITIAL       PLAN: PT FREQUENCY: 2x/week x 3 weeks then 1 x for 3 more weeks    PT DURATION: 6 weeks    PLANNED INTERVENTIONS: Therapeutic exercises, Therapeutic activity, Neuromuscular re-education, Balance training, Gait training, Patient/Family education, Self Care, Joint mobilization, Dry Needling, Cryotherapy, Moist heat, Taping, Ultrasound, Ionotophoresis 62m/ml Dexamethasone, Manual therapy, and Re-evaluation   PLAN FOR NEXT SESSION: check HEP, UBE, scapular stab.  Check scap in closed chain. Modalities as needed    JRaeford Razor PT 09/26/21 1:53 PM Phone: [phone]: [phone]
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
## 2021-09-26 ENCOUNTER — Ambulatory Visit: Payer: 59 | Admitting: Physical Therapy

## 2021-09-26 DIAGNOSIS — R293 Abnormal posture: Secondary | ICD-10-CM

## 2021-09-26 DIAGNOSIS — G8929 Other chronic pain: Secondary | ICD-10-CM

## 2021-09-26 DIAGNOSIS — M25512 Pain in left shoulder: Secondary | ICD-10-CM | POA: Diagnosis not present

## 2021-09-28 ENCOUNTER — Encounter: Payer: Self-pay | Admitting: Physical Therapy

## 2021-09-28 ENCOUNTER — Ambulatory Visit: Payer: 59 | Admitting: Physical Therapy

## 2021-09-28 DIAGNOSIS — R293 Abnormal posture: Secondary | ICD-10-CM

## 2021-09-28 DIAGNOSIS — G8929 Other chronic pain: Secondary | ICD-10-CM

## 2021-09-28 DIAGNOSIS — M25512 Pain in left shoulder: Secondary | ICD-10-CM | POA: Diagnosis not present

## 2021-09-28 NOTE — Therapy (Signed)
OUTPATIENT PHYSICAL THERAPY TREATMENT NOTE   Patient Name: Sheena Gonzales MRN: [id number] DOB:05-01-1970, 51 y.o., female Today's Date: 09/28/2021  PCP/REFERRING PROVIDER: Rachell Cipro, MD  END OF SESSION:   PT End of Session - 09/28/21 0853     Visit Number 5    Number of Visits 12    Date for PT Re-Evaluation 11/08/21    Authorization Type UHC    PT Start Time 0845    PT Stop Time 0930    PT Time Calculation (min) 45 min    Activity Tolerance Patient tolerated treatment well    Behavior During Therapy [facility] for tasks assessed/performed               Past Medical History:  Diagnosis Date   Allergy    Anxiety    Substance abuse ([place]) 2016   AA   Past Surgical History:  Procedure Laterality Date   ablation  2006   uterine   COLONOSCOPY     FOOT SURGERY Right 1991   Patient Active Problem List   Diagnosis Date Noted   Substance abuse ([place]) 06/24/2020    REFERRING DIAG: Shoulder pain   THERAPY DIAG:  Chronic left shoulder pain  Abnormal posture  Rationale for Evaluation and Treatment Rehabilitation  PERTINENT HISTORY: see above   PRECAUTIONS: none   SUBJECTIVE: I was really sore yesterday. Used ice and that really helped.   PAIN:  Are you having pain? Yes: NPRS scale: 0/10 Pain location: L shoulder  Pain description: sore Aggravating factors: reaching back  Relieving factors: rest  Pain mostly when reaching back  OBJECTIVE: (objective measures completed at initial evaluation unless otherwise dated)  DIAGNOSTIC FINDINGS:  IMPRESSION: 1. Moderate rotator cuff tendinopathy/tendinosis most notably involving the supraspinatus tendon. There are partial-thickness articular and bursal surface tears as detailed above. No full-thickness retracted tear. 2. Intact long head biceps tendon and glenoid labrum. 3. Moderate AC joint degenerative changes but no other significant findings for bony impingement. 4. Mild subacromial/subdeltoid bursitis.        PATIENT SURVEYS:  FOTO 59% goal is 73 %    COGNITION:           Overall cognitive status: Within functional limits for tasks assessed                                  SENSATION: WFL   POSTURE: Slightly rounded shoulders with glenohumeral head more anterior L shoulder with goal post position on the wall.  Scapula abducted from midline on L side. Bilateral she has anterior seated humeral head.    UPPER EXTREMITY ROM:                           Rt UE was more stiff than Lt. She fell on Rt UE awhile ago but no pain in Rt UE     Active ROM Right eval Left eval  Shoulder flexion [facility] WFL, 160 no pain   Shoulder extension      Shoulder abduction WFL WFL,  160 no pain   Shoulder adduction      Shoulder internal rotation FR to T8 FR to T6  Shoulder external rotation FR to T1 FR to T2  Elbow flexion      Elbow extension      Wrist flexion      Wrist extension      Wrist ulnar deviation  Wrist radial deviation      Wrist pronation      Wrist supination      (Blank rows = not tested)   UPPER EXTREMITY MMT:   MMT Right eval Left eval  Shoulder flexion 5 5  Shoulder extension      Shoulder abduction 5 5  Shoulder adduction      Shoulder internal rotation 5 5  Shoulder external rotation 5 4+  Middle trapezius 4+ 4  Lower trapezius      Elbow flexion      Elbow extension      Wrist flexion      Wrist extension      Wrist ulnar deviation      Wrist radial deviation      Wrist pronation      Wrist supination      Grip strength (lbs) 56 lbs 57.6 lbs  (Blank rows = not tested)   SHOULDER SPECIAL TESTS:            Impingement tests: Hawkins/Kennedy impingement test: negative and Painful arc test: negative            SLAP lesions:  NT            Instability tests: Apprehension test: negative             JOINT MOBILITY TESTING:  No stiffness in joint Post capsule tight L> R        [facility] Adult PT Treatment:                                                DATE:  09/28/21 Therapeutic Exercise: UBE 5 min , 2.5 min each direction  Foam roller thoracic extension Foam roller core work, stabilization  Alternating arm flexion/extension, horizontal pull green band, diagonal pull green band, green looped band overhead lift Dead bug variations Sidelying post delt fly x 15 , 4 lbs , 2 sets Sidelying ER , 4 lbs x 15 , 2 sets  Sidelying abduction x 15 , 4 lbs  2 sets  Quadruped 3 lbs  shoulder flexion with opp leg extended each side x 10 small ROM  Bent over row 10 lbs knee on table x 10, then 15 lbs x 10 LUE only      [facility] Adult PT Treatment:                                                DATE: 09/26/21 Therapeutic Exercise: Foam roller on the wall: overhead mobility, star pattern and horizontal pull with green band x 20 reps each Looped band overhead lift x 10 then chest press (scapular stabilizers) x 10  Facing wall, rolling up and down for lat release and serratus   Reformer Quadruped 1 red press out to plank x 10 , min cues for alignment in head neck and shoulders  Row 1 red roll down  x 10  Horizontal abd 1 red  x 10  ER/IR 1 red spring x 15  Supine arm arcs 1 red 1 yellow x 10 in parallel and then in abduction              [facility] Adult PT Treatment:  DATE: 09/23/21 Therapeutic Exercise: UBE level 1 , 5 min (2.5 min each direction)  Standing bands (green and black) -progressed to Free motion 20# low row 15 x 2  Free motion ext 14# bil shoulder ext  15 x 2  Free Motion high row 6# 15 x 2  Standing horizontal abduction with star Pattern 7 reps x 2  Bilat shoulder ER green band x 30, x 25 Sidelying post delt fly x 15 , 4 lbs  Sidelying ER , 4 lbs x 15  Sidelying abduction x 15 , 4 lbs  Sleeper stretch each side 10 sec x 5  POE plank 10 sec      PATIENT EDUCATION: Education details: HEP, eval findings, PT process Person educated: Patient Education method: Explanation, Demonstration, Verbal cues,  and Handouts Education comprehension: verbalized understanding and needs further education     HOME EXERCISE PROGRAM: Access Code: [email or web address] URL: [email or web address] Date: 09/20/2021 Prepared by: Raeford Razor  Exercises - Sleeper Stretch  - 1 x daily - 7 x weekly - 1 sets - 10 reps - 15 hold - Shoulder External Rotation and Scapular Retraction with Resistance  - 2 x daily - 7 x weekly - 3 sets - 10 reps - Standing Low Trap Setting with Resistance at [place]  - 2 x daily - 7 x weekly - 2 sets - 10 reps - 5 hold - Seated High Shoulder Row with Anchored Resistance  - 2 x daily - 7 x weekly - 2 sets - 10 reps - 5 hold - Sidelying Shoulder ER with Towel and Dumbbell  - 1 x daily - 7 x weekly - 2 sets - 10 reps - 5 hold - Sidelying Shoulder Abduction Palm Forward  - 1 x daily - 7 x weekly - 2 sets - 10 reps - 30 hold  ASSESSMENT:   CLINICAL IMPRESSION: Patient tolerated some challenging shoulder work quite well. Used the foam roller for core stability during shoulder strengthening to release paraspinals as well.  She is benefiting from skilled PT to improve shoulder performance, mechanics and overall postural alignment     OBJECTIVE IMPAIRMENTS decreased strength, increased fascial restrictions, impaired flexibility, impaired UE functional use, postural dysfunction, and pain.    ACTIVITY LIMITATIONS carrying, lifting, sleeping, and reach over head   PARTICIPATION LIMITATIONS: community activity and occupation   PERSONAL FACTORS Time since onset of injury/illness/exacerbation are also affecting patient's functional outcome.    REHAB POTENTIAL: Excellent   CLINICAL DECISION MAKING: Stable/uncomplicated   EVALUATION COMPLEXITY: Low     GOALS: Goals reviewed with patient? Yes   SHORT TERM GOALS: Target date: 10/11/2021  (Remove Blue Hyperlink)   Pt will be I with HEP for bilateral UE strength and mobility  Baseline:given on eval  Goal status: met    2.  Pt will be  able to sleep/lie on L shoulder without disturbance of pain for short periods of time  Baseline:  Status: 09/23/21: pain has not been waking her this week Goal status: met   3.  Pt will lift light items (< 25 lbs) overhead (mimicking bike lift) with no pain to impact long term UE function  Baseline:  Goal status: ONGOING       LONG TERM GOALS: Target date: 11/08/2021  (Remove Blue Hyperlink)   Pt will be able to improve FOTO score to 70% or better  Baseline: 59% Goal status: INITIAL   2.  Pt will be able to lift bike onto her rack with confidence and no  pain increase > 75% of the time  Baseline:  Goal status: INITIAL   3.  Pt will be able to perform a modified push up (on knees) without increased shoulder pain  Baseline: popping, pain with full push up  Goal status: INITIAL   4.  Pt will be I with final HEP upon discharge  Baseline:  Goal status: INITIAL       PLAN: PT FREQUENCY: 2x/week x 3 weeks then 1 x for 3 more weeks    PT DURATION: 6 weeks    PLANNED INTERVENTIONS: Therapeutic exercises, Therapeutic activity, Neuromuscular re-education, Balance training, Gait training, Patient/Family education, Self Care, Joint mobilization, Dry Needling, Cryotherapy, Moist heat, Taping, Ultrasound, Ionotophoresis 64m/ml Dexamethasone, Manual therapy, and Re-evaluation   PLAN FOR NEXT SESSION: check HEP, UBE, scapular stab.  Check scap in closed chain. Modalities as needed   Try some load carries, lifting overhead?    JRaeford Razor PT 09/28/21 11:18 AM Phone: [phone]: [phone]

## 2021-10-03 ENCOUNTER — Ambulatory Visit: Payer: 59 | Admitting: Physical Therapy

## 2021-10-05 ENCOUNTER — Ambulatory Visit: Payer: 59 | Attending: Family Medicine | Admitting: Physical Therapy

## 2021-10-05 ENCOUNTER — Encounter: Payer: Self-pay | Admitting: Physical Therapy

## 2021-10-05 DIAGNOSIS — R293 Abnormal posture: Secondary | ICD-10-CM | POA: Insufficient documentation

## 2021-10-05 DIAGNOSIS — M25512 Pain in left shoulder: Secondary | ICD-10-CM | POA: Insufficient documentation

## 2021-10-05 DIAGNOSIS — G8929 Other chronic pain: Secondary | ICD-10-CM | POA: Insufficient documentation

## 2021-10-05 NOTE — Therapy (Signed)
OUTPATIENT PHYSICAL THERAPY TREATMENT NOTE   Patient Name: Sheena Gonzales MRN: [id number] DOB:1970/12/04, 51 y.o., female Today's Date: 10/05/2021  PCP/REFERRING PROVIDER: Rachell Cipro, MD  END OF SESSION:   PT End of Session - 10/05/21 0851     Visit Number 6    Number of Visits 12    Date for PT Re-Evaluation 11/08/21    Authorization Type UHC    PT Start Time [phone]    PT Stop Time 0930    PT Time Calculation (min) 44 min    Activity Tolerance Patient tolerated treatment well    Behavior During Therapy [facility] for tasks assessed/performed                Past Medical History:  Diagnosis Date   Allergy    Anxiety    Substance abuse ([place]) 2016   AA   Past Surgical History:  Procedure Laterality Date   ablation  2006   uterine   COLONOSCOPY     FOOT SURGERY Right 1991   Patient Active Problem List   Diagnosis Date Noted   Substance abuse ([place]) 06/24/2020    REFERRING DIAG: Shoulder pain   THERAPY DIAG:  Chronic left shoulder pain  Abnormal posture  Rationale for Evaluation and Treatment Rehabilitation  PERTINENT HISTORY: see above   PRECAUTIONS: none   SUBJECTIVE: No problems with shoulder on vacation.  No pain right now.   PAIN:  Are you having pain? Yes: NPRS scale: 0/10 Pain location: L shoulder  Pain description: sore Aggravating factors: reaching back  Relieving factors: rest  Pain mostly when reaching back  OBJECTIVE: (objective measures completed at initial evaluation unless otherwise dated)  DIAGNOSTIC FINDINGS:  IMPRESSION: 1. Moderate rotator cuff tendinopathy/tendinosis most notably involving the supraspinatus tendon. There are partial-thickness articular and bursal surface tears as detailed above. No full-thickness retracted tear. 2. Intact long head biceps tendon and glenoid labrum. 3. Moderate AC joint degenerative changes but no other significant findings for bony impingement. 4. Mild subacromial/subdeltoid bursitis.        PATIENT SURVEYS:  FOTO 59% goal is 73 %    COGNITION:           Overall cognitive status: Within functional limits for tasks assessed                                  SENSATION: WFL   POSTURE: Slightly rounded shoulders with glenohumeral head more anterior L shoulder with goal post position on the wall.  Scapula abducted from midline on L side. Bilateral she has anterior seated humeral head.    UPPER EXTREMITY ROM:                           Rt UE was more stiff than Lt. She fell on Rt UE awhile ago but no pain in Rt UE     Active ROM Right eval Left eval  Shoulder flexion [facility] WFL, 160 no pain   Shoulder extension      Shoulder abduction WFL WFL,  160 no pain   Shoulder adduction      Shoulder internal rotation FR to T8 FR to T6  Shoulder external rotation FR to T1 FR to T2  Elbow flexion      Elbow extension      Wrist flexion      Wrist extension      Wrist ulnar deviation  Wrist radial deviation      Wrist pronation      Wrist supination      (Blank rows = not tested)   UPPER EXTREMITY MMT:   MMT Right eval Left eval  Shoulder flexion 5 5  Shoulder extension      Shoulder abduction 5 5  Shoulder adduction      Shoulder internal rotation 5 5  Shoulder external rotation 5 4+  Middle trapezius 4+ 4  Lower trapezius      Elbow flexion      Elbow extension      Wrist flexion      Wrist extension      Wrist ulnar deviation      Wrist radial deviation      Wrist pronation      Wrist supination      Grip strength (lbs) 56 lbs 57.6 lbs  (Blank rows = not tested)   SHOULDER SPECIAL TESTS:            Impingement tests: Hawkins/Kennedy impingement test: negative and Painful arc test: negative            SLAP lesions:  NT            Instability tests: Apprehension test: negative             JOINT MOBILITY TESTING:  No stiffness in joint Post capsule tight L> R      [facility] Adult PT Treatment:                                                DATE:  10/05/21 Therapeutic Exercise: Pilates Reformer used for LE/core strength, postural strength, lumbopelvic disassociation and core control.  Exercises included: Footwork 2 red 1 blue 1 Yellow while taking subjective and prep for session  Supine Arm work  1 red 1 blue: Arcs in parallel and in T with ball held in table top  Supine Abs   1 red 1 blue upper ab curl x 10 , 2 sets , 2nd set with legs extension     Quadruped  Moving plank on platform x 10 each , 2 sets, 1 red than 1 blue for challenge in core   Reverse Abdominals  1 blue x 10 UE only then added LE x 10   Long box Prone Pulling straps 1 red x 10  Triceps x 10  Overhead press 1 x 15 double arm then x 10 single arm Clicking noted L shoulder (clavicle) full ROM   Weighted carry 5 lbs KB , bell up elbow flexed arm up then arm out to 90 deg abd  Lateral and forward raise 5 lbs x 8 each stand in mirror     [facility] Adult PT Treatment:                                                DATE: 09/28/21 Therapeutic Exercise: UBE 5 min , 2.5 min each direction  Foam roller thoracic extension Foam roller core work, stabilization  Alternating arm flexion/extension, horizontal pull green band, diagonal pull green band, green looped band overhead lift Dead bug variations Sidelying post delt fly x 15 , 4 lbs , 2 sets Sidelying ER , 4  lbs x 15 , 2 sets  Sidelying abduction x 15 , 4 lbs  2 sets  Quadruped 3 lbs  shoulder flexion with opp leg extended each side x 10 small ROM  Bent over row 10 lbs knee on table x 10, then 15 lbs x 10 LUE only      [facility] Adult PT Treatment:                                                DATE: 09/26/21 Therapeutic Exercise: Foam roller on the wall: overhead mobility, star pattern and horizontal pull with green band x 20 reps each Looped band overhead lift x 10 then chest press (scapular stabilizers) x 10  Facing wall, rolling up and down for lat release and serratus   Reformer Quadruped 1 red press out to  plank x 10 , min cues for alignment in head neck and shoulders  Row 1 red roll down  x 10  Horizontal abd 1 red  x 10  ER/IR 1 red spring x 15  Supine arm arcs 1 red 1 yellow x 10 in parallel and then in abduction              [facility] Adult PT Treatment:                                                DATE: 09/23/21 Therapeutic Exercise: UBE level 1 , 5 min (2.5 min each direction)  Standing bands (green and black) -progressed to Free motion 20# low row 15 x 2  Free motion ext 14# bil shoulder ext  15 x 2  Free Motion high row 6# 15 x 2  Standing horizontal abduction with star Pattern 7 reps x 2  Bilat shoulder ER green band x 30, x 25 Sidelying post delt fly x 15 , 4 lbs  Sidelying ER , 4 lbs x 15  Sidelying abduction x 15 , 4 lbs  Sleeper stretch each side 10 sec x 5  POE plank 10 sec      PATIENT EDUCATION: Education details: HEP, eval findings, PT process Person educated: Patient Education method: Explanation, Demonstration, Verbal cues, and Handouts Education comprehension: verbalized understanding and needs further education     HOME EXERCISE PROGRAM: Access Code: [email or web address] URL: [email or web address] Date: 09/20/2021 Prepared by: Raeford Razor  Exercises - Sleeper Stretch  - 1 x daily - 7 x weekly - 1 sets - 10 reps - 15 hold - Shoulder External Rotation and Scapular Retraction with Resistance  - 2 x daily - 7 x weekly - 3 sets - 10 reps - Standing Low Trap Setting with Resistance at [place]  - 2 x daily - 7 x weekly - 2 sets - 10 reps - 5 hold - Seated High Shoulder Row with Anchored Resistance  - 2 x daily - 7 x weekly - 2 sets - 10 reps - 5 hold - Sidelying Shoulder ER with Towel and Dumbbell  - 1 x daily - 7 x weekly - 2 sets - 10 reps - 5 hold - Sidelying Shoulder Abduction Palm Forward  - 1 x daily - 7 x weekly - 2 sets - 10 reps - 30 hold  ASSESSMENT:   CLINICAL  IMPRESSION: Pam continues to do HEP consistently and have min to no pain .  She is dropping  back to 1 x per week here at the clinic.  Fatigues with stabilization exercises with min crepitus with overhead lifting. She will likely need only 2-3 more visits, will increase load and stabilization challenges, including Pilates.    OBJECTIVE IMPAIRMENTS decreased strength, increased fascial restrictions, impaired flexibility, impaired UE functional use, postural dysfunction, and pain.    ACTIVITY LIMITATIONS carrying, lifting, sleeping, and reach over head   PARTICIPATION LIMITATIONS: community activity and occupation   PERSONAL FACTORS Time since onset of injury/illness/exacerbation are also affecting patient's functional outcome.    REHAB POTENTIAL: Excellent   CLINICAL DECISION MAKING: Stable/uncomplicated   EVALUATION COMPLEXITY: Low     GOALS: Goals reviewed with patient? Yes   SHORT TERM GOALS: Target date: 10/11/2021  (Remove Blue Hyperlink)   Pt will be I with HEP for bilateral UE strength and mobility  Baseline:given on eval  Goal status: met    2.  Pt will be able to sleep/lie on L shoulder without disturbance of pain for short periods of time  Baseline:  Status: 09/23/21: pain has not been waking her this week Goal status: met   3.  Pt will lift light items (< 25 lbs) overhead (mimicking bike lift) with no pain to impact long term UE function  Baseline:  Goal status: ONGOING       LONG TERM GOALS: Target date: 11/08/2021  (Remove Blue Hyperlink)   Pt will be able to improve FOTO score to 70% or better  Baseline: 59% Goal status: INITIAL   2.  Pt will be able to lift bike onto her rack with confidence and no pain increase > 75% of the time  Baseline:  Goal status: INITIAL   3.  Pt will be able to perform a modified push up (on knees) without increased shoulder pain  Baseline: popping, pain with full push up  Goal status: INITIAL   4.  Pt will be I with final HEP upon discharge  Baseline:  Goal status: INITIAL       PLAN: PT FREQUENCY: 2x/week x 3  weeks then 1 x for 3 more weeks    PT DURATION: 6 weeks    PLANNED INTERVENTIONS: Therapeutic exercises, Therapeutic activity, Neuromuscular re-education, Balance training, Gait training, Patient/Family education, Self Care, Joint mobilization, Dry Needling, Cryotherapy, Moist heat, Taping, Ultrasound, Ionotophoresis 46m/ml Dexamethasone, Manual therapy, and Re-evaluation   PLAN FOR NEXT SESSION: check GOALS.  Check scap in closed chain. Modalities as needed   Try some load carries, lifting overhead?    JRaeford Razor PT 10/05/21 8:54 AM Phone: [phone]: [phone]

## 2021-10-10 ENCOUNTER — Ambulatory Visit: Payer: 59 | Admitting: Physical Therapy

## 2021-10-10 ENCOUNTER — Encounter: Payer: Self-pay | Admitting: Physical Therapy

## 2021-10-10 DIAGNOSIS — R293 Abnormal posture: Secondary | ICD-10-CM

## 2021-10-10 DIAGNOSIS — M25512 Pain in left shoulder: Secondary | ICD-10-CM | POA: Diagnosis not present

## 2021-10-10 DIAGNOSIS — G8929 Other chronic pain: Secondary | ICD-10-CM

## 2021-10-10 NOTE — Therapy (Signed)
OUTPATIENT PHYSICAL THERAPY TREATMENT NOTE   Patient Name: Sheena Gonzales MRN: [id number] DOB:1970/11/16, 51 y.o., female Today's Date: 10/10/2021  PCP/REFERRING PROVIDER: Rachell Cipro, MD  END OF SESSION:   PT End of Session - 10/10/21 0852     Visit Number 7    Number of Visits 12    Date for PT Re-Evaluation 11/08/21    Authorization Type UHC    PT Start Time 0850    PT Stop Time 0933    PT Time Calculation (min) 43 min                Past Medical History:  Diagnosis Date   Allergy    Anxiety    Substance abuse ([place]) 2016   AA   Past Surgical History:  Procedure Laterality Date   ablation  2006   uterine   COLONOSCOPY     FOOT SURGERY Right 1991   Patient Active Problem List   Diagnosis Date Noted   Substance abuse ([place]) 06/24/2020    REFERRING DIAG: Shoulder pain   THERAPY DIAG:  Chronic left shoulder pain  Abnormal posture  Rationale for Evaluation and Treatment Rehabilitation  PERTINENT HISTORY: see above   PRECAUTIONS: none   SUBJECTIVE:  I can lie on my L shoulder at night without waking up.  I am riding today so that will be revealing.    PAIN:  Are you having pain? Yes: NPRS scale: 0/10 Pain location: L shoulder  Pain description: sore Aggravating factors: reaching back  Relieving factors: rest  Pain mostly when reaching back  OBJECTIVE: (objective measures completed at initial evaluation unless otherwise dated)  DIAGNOSTIC FINDINGS:  IMPRESSION: 1. Moderate rotator cuff tendinopathy/tendinosis most notably involving the supraspinatus tendon. There are partial-thickness articular and bursal surface tears as detailed above. No full-thickness retracted tear. 2. Intact long head biceps tendon and glenoid labrum. 3. Moderate AC joint degenerative changes but no other significant findings for bony impingement. 4. Mild subacromial/subdeltoid bursitis.       PATIENT SURVEYS:  FOTO 59% goal is 73 %  FOTO update: 10/10/21:  72%   COGNITION:           Overall cognitive status: Within functional limits for tasks assessed                                  SENSATION: WFL   POSTURE: Slightly rounded shoulders with glenohumeral head more anterior L shoulder with goal post position on the wall.  Scapula abducted from midline on L side. Bilateral she has anterior seated humeral head.    UPPER EXTREMITY ROM:                           Rt UE was more stiff than Lt. She fell on Rt UE awhile ago but no pain in Rt UE     Active ROM Right eval Left eval  Shoulder flexion [facility] WFL, 160 no pain   Shoulder extension      Shoulder abduction WFL WFL,  160 no pain   Shoulder adduction      Shoulder internal rotation FR to T8 FR to T6  Shoulder external rotation FR to T1 FR to T2  Elbow flexion      Elbow extension      Wrist flexion      Wrist extension      Wrist ulnar deviation  Wrist radial deviation      Wrist pronation      Wrist supination      (Blank rows = not tested)   UPPER EXTREMITY MMT:   MMT Right eval Left eval  Shoulder flexion 5 5  Shoulder extension      Shoulder abduction 5 5  Shoulder adduction      Shoulder internal rotation 5 5  Shoulder external rotation 5 4+  Middle trapezius 4+ 4  Lower trapezius      Elbow flexion      Elbow extension      Wrist flexion      Wrist extension      Wrist ulnar deviation      Wrist radial deviation      Wrist pronation      Wrist supination      Grip strength (lbs) 56 lbs 57.6 lbs  (Blank rows = not tested)   SHOULDER SPECIAL TESTS:            Impingement tests: Hawkins/Kennedy impingement test: negative and Painful arc test: negative            SLAP lesions:  NT            Instability tests: Apprehension test: negative             JOINT MOBILITY TESTING:  No stiffness in joint Post capsule tight L> R  [facility] Adult PT Treatment:                                                DATE: 10/10/21 Therapeutic Exercise: UBE 5 min L2  Blue band  horizontal abd blue and then star patterns x 15 each  ER/IR x 20 blue band  Bicep curls 15 lbs x 1 min Bent over row 15 lbs x 15  Single arm reverse fly red loop x 15  Abduction single arm red loop x 15  Sustained ER with shoulder flexion red loop x 10 (sidestepping)  Blue band ER with shoulder at 90 deg flexion x 10 Blue band ER with abduction 90 deg x 10  Overhead press 10 lbs double arm x 10  Front plank with leg lifts  Mod push up on high mat- pain , also with knees on mat table  Sideplank on L elbow hold then added hip dips x 10 Pilates upstretch 1 red 1 yellow (plank) with press out x 10 no pain .     [facility] Adult PT Treatment:                                                DATE: 10/05/21 Therapeutic Exercise: Pilates Reformer used for LE/core strength, postural strength, lumbopelvic disassociation and core control.  Exercises included: Footwork 2 red 1 blue 1 Yellow while taking subjective and prep for session  Supine Arm work  1 red 1 blue: Arcs in parallel and in T with ball held in table top  Supine Abs   1 red 1 blue upper ab curl x 10 , 2 sets , 2nd set with legs extension     Quadruped  Moving plank on platform x 10 each , 2 sets, 1 red than 1 blue for challenge in  core   Reverse Abdominals  1 blue x 10 UE only then added LE x 10   Long box Prone Pulling straps 1 red x 10  Triceps x 10  Overhead press 1 x 15 double arm then x 10 single arm Clicking noted L shoulder (clavicle) full ROM   Weighted carry 5 lbs KB , bell up elbow flexed arm up then arm out to 90 deg abd  Lateral and forward raise 5 lbs x 8 each stand in mirror     [facility] Adult PT Treatment:                                                DATE: 09/28/21 Therapeutic Exercise: UBE 5 min , 2.5 min each direction  Foam roller thoracic extension Foam roller core work, stabilization  Alternating arm flexion/extension, horizontal pull green band, diagonal pull green band, green looped band overhead  lift Dead bug variations Sidelying post delt fly x 15 , 4 lbs , 2 sets Sidelying ER , 4 lbs x 15 , 2 sets  Sidelying abduction x 15 , 4 lbs  2 sets  Quadruped 3 lbs  shoulder flexion with opp leg extended each side x 10 small ROM  Bent over row 10 lbs knee on table x 10, then 15 lbs x 10 LUE only      [facility] Adult PT Treatment:                                                DATE: 09/26/21 Therapeutic Exercise: Foam roller on the wall: overhead mobility, star pattern and horizontal pull with green band x 20 reps each Looped band overhead lift x 10 then chest press (scapular stabilizers) x 10  Facing wall, rolling up and down for lat release and serratus   Reformer Quadruped 1 red press out to plank x 10 , min cues for alignment in head neck and shoulders  Row 1 red roll down  x 10  Horizontal abd 1 red  x 10  ER/IR 1 red spring x 15  Supine arm arcs 1 red 1 yellow x 10 in parallel and then in abduction              [facility] Adult PT Treatment:                                                DATE: 09/23/21 Therapeutic Exercise: UBE level 1 , 5 min (2.5 min each direction)  Standing bands (green and black) -progressed to Free motion 20# low row 15 x 2  Free motion ext 14# bil shoulder ext  15 x 2  Free Motion high row 6# 15 x 2  Standing horizontal abduction with star Pattern 7 reps x 2  Bilat shoulder ER green band x 30, x 25 Sidelying post delt fly x 15 , 4 lbs  Sidelying ER , 4 lbs x 15  Sidelying abduction x 15 , 4 lbs  Sleeper stretch each side 10 sec x 5  POE plank 10 sec      PATIENT EDUCATION:  Education details: HEP, eval findings, PT process Person educated: Patient Education method: Explanation, Demonstration, Verbal cues, and Handouts Education comprehension: verbalized understanding and needs further education     HOME EXERCISE PROGRAM: Access Code: [email or web address] URL: [email or web address] Date: 09/20/2021 Prepared by: Raeford Razor  Exercises - Sleeper  Stretch  - 1 x daily - 7 x weekly - 1 sets - 10 reps - 15 hold - Shoulder External Rotation and Scapular Retraction with Resistance  - 2 x daily - 7 x weekly - 3 sets - 10 reps - Standing Low Trap Setting with Resistance at [place]  - 2 x daily - 7 x weekly - 2 sets - 10 reps - 5 hold - Seated High Shoulder Row with Anchored Resistance  - 2 x daily - 7 x weekly - 2 sets - 10 reps - 5 hold - Sidelying Shoulder ER with Towel and Dumbbell  - 1 x daily - 7 x weekly - 2 sets - 10 reps - 5 hold - Sidelying Shoulder Abduction Palm Forward  - 1 x daily - 7 x weekly - 2 sets - 10 reps - 30 hold  ASSESSMENT:   CLINICAL IMPRESSION: Sheena Gonzales is showing good progress, meeting goals or FOTO and ability to lie on L shoudler.  She will ride her bike today and need to lift her bike to waist height. She did have some pain in L shoulder (post) with push ups despite modifying. Will cont to work on scapular position and mechanics.  Planks and added challenges do not increase her shoulder pain .   OBJECTIVE IMPAIRMENTS decreased strength, increased fascial restrictions, impaired flexibility, impaired UE functional use, postural dysfunction, and pain.    ACTIVITY LIMITATIONS carrying, lifting, sleeping, and reach over head   PARTICIPATION LIMITATIONS: community activity and occupation   PERSONAL FACTORS Time since onset of injury/illness/exacerbation are also affecting patient's functional outcome.    REHAB POTENTIAL: Excellent   CLINICAL DECISION MAKING: Stable/uncomplicated   EVALUATION COMPLEXITY: Low     GOALS: Goals reviewed with patient? Yes   SHORT TERM GOALS: Target date: 10/11/2021  (Remove Blue Hyperlink)   Pt will be I with HEP for bilateral UE strength and mobility  Baseline:given on eval  Goal status: met    2.  Pt will be able to sleep/lie on L shoulder without disturbance of pain for short periods of time  Baseline:  Status: 09/23/21: pain has not been waking her this week Goal status: met    3.  Pt will lift light items (< 25 lbs) overhead (mimicking bike lift) with no pain to impact long term UE function  Baseline:  Goal status: ONGOING       LONG TERM GOALS: Target date: 11/08/2021  (Remove Blue Hyperlink)   Pt will be able to improve FOTO score to 70% or better  Baseline: 59%, update 72% goal is 73% Goal status:MET   2.  Pt will be able to lift bike onto her rack with confidence and no pain increase > 75% of the time  Baseline: will see today!  Goal status: ongoing   3.  Pt will be able to perform a modified push up (on knees) without increased shoulder pain  Baseline: popping, pain with full push up . Update: pain with 1 push up today with 2 modifcations.  Goal status:ongoing    4.  Pt will be I with final HEP upon discharge  Baseline:  Goal status: ongoing        PLAN: PT FREQUENCY:  2x/week x 3 weeks then 1 x for 3 more weeks    PT DURATION: 6 weeks    PLANNED INTERVENTIONS: Therapeutic exercises, Therapeutic activity, Neuromuscular re-education, Balance training, Gait training, Patient/Family education, Self Care, Joint mobilization, Dry Needling, Cryotherapy, Moist heat, Taping, Ultrasound, Ionotophoresis 15m/ml Dexamethasone, Manual therapy, and Re-evaluation   PLAN FOR NEXT SESSION:  Check scap in closed chain: wall push ups. Sidelying Modalities as needed   Try some load carries, lifting overhead?    JRaeford Razor PT 10/10/21 9:43 AM Phone: [phone]: [phone]

## 2021-10-17 ENCOUNTER — Ambulatory Visit: Payer: 59 | Admitting: Physical Therapy

## 2021-10-17 ENCOUNTER — Encounter: Payer: Self-pay | Admitting: Physical Therapy

## 2021-10-17 DIAGNOSIS — G8929 Other chronic pain: Secondary | ICD-10-CM

## 2021-10-17 DIAGNOSIS — R293 Abnormal posture: Secondary | ICD-10-CM

## 2021-10-17 DIAGNOSIS — M25512 Pain in left shoulder: Secondary | ICD-10-CM | POA: Diagnosis not present

## 2021-10-17 NOTE — Therapy (Signed)
OUTPATIENT PHYSICAL THERAPY TREATMENT NOTE   Patient Name: Sheena Gonzales MRN: [id number] DOB:July 17, 1970, 51 y.o., female Today's Date: 10/17/2021  PCP/REFERRING PROVIDER: Rachell Cipro, MD  END OF SESSION:   PT End of Session - 10/17/21 0854     Visit Number 8    Number of Visits 12    Date for PT Re-Evaluation 11/08/21    Authorization Type UHC    PT Start Time [phone]    PT Stop Time 0930    PT Time Calculation (min) 39 min    Activity Tolerance Patient tolerated treatment well    Behavior During Therapy [facility] for tasks assessed/performed                 Past Medical History:  Diagnosis Date   Allergy    Anxiety    Substance abuse ([place]) 2016   AA   Past Surgical History:  Procedure Laterality Date   ablation  2006   uterine   COLONOSCOPY     FOOT SURGERY Right 1991   Patient Active Problem List   Diagnosis Date Noted   Substance abuse ([place]) 06/24/2020    REFERRING DIAG: Shoulder pain   THERAPY DIAG:  Chronic left shoulder pain  Abnormal posture  Rationale for Evaluation and Treatment Rehabilitation  PERTINENT HISTORY: see above   PRECAUTIONS: none   SUBJECTIVE:  My shoulder is just now getting better.  The push up really got my shoulder messed.   PAIN:  Are you having pain? Yes: NPRS scale: 0/10 Pain location: L shoulder  Pain description: sore Aggravating factors: reaching back  Relieving factors: rest  Pain mostly when reaching back  OBJECTIVE: (objective measures completed at initial evaluation unless otherwise dated)  DIAGNOSTIC FINDINGS:  IMPRESSION: 1. Moderate rotator cuff tendinopathy/tendinosis most notably involving the supraspinatus tendon. There are partial-thickness articular and bursal surface tears as detailed above. No full-thickness retracted tear. 2. Intact long head biceps tendon and glenoid labrum. 3. Moderate AC joint degenerative changes but no other significant findings for bony impingement. 4. Mild  subacromial/subdeltoid bursitis.       PATIENT SURVEYS:  FOTO 59% goal is 73 %  FOTO update: 10/10/21: 72%   COGNITION:           Overall cognitive status: Within functional limits for tasks assessed                                  SENSATION: WFL   POSTURE: Slightly rounded shoulders with glenohumeral head more anterior L shoulder with goal post position on the wall.  Scapula abducted from midline on L side. Bilateral she has anterior seated humeral head.    UPPER EXTREMITY ROM:                           Rt UE was more stiff than Lt. She fell on Rt UE awhile ago but no pain in Rt UE     Active ROM Right eval Left eval  Shoulder flexion [facility] WFL, 160 no pain   Shoulder extension      Shoulder abduction WFL WFL,  160 no pain   Shoulder adduction      Shoulder internal rotation FR to T8 FR to T6  Shoulder external rotation FR to T1 FR to T2  Elbow flexion      Elbow extension      Wrist flexion  Wrist extension      Wrist ulnar deviation      Wrist radial deviation      Wrist pronation      Wrist supination      (Blank rows = not tested)   UPPER EXTREMITY MMT:   MMT Right eval Left eval  Shoulder flexion 5 5  Shoulder extension      Shoulder abduction 5 5  Shoulder adduction      Shoulder internal rotation 5 5  Shoulder external rotation 5 4+  Middle trapezius 4+ 4  Lower trapezius      Elbow flexion      Elbow extension      Wrist flexion      Wrist extension      Wrist ulnar deviation      Wrist radial deviation      Wrist pronation      Wrist supination      Grip strength (lbs) 56 lbs 57.6 lbs  (Blank rows = not tested)   SHOULDER SPECIAL TESTS:            Impingement tests: Hawkins/Kennedy impingement test: negative and Painful arc test: negative            SLAP lesions:  NT            Instability tests: Apprehension test: negative             JOINT MOBILITY TESTING:  No stiffness in joint Post capsule tight L> R   [facility] Adult PT Treatment:                                                 DATE: 10/17/21 Therapeutic Exercise: UBE level 1: 3 min. forward, 3 min. back Supine foam roller core and mobility work: narrow grip overhead lift x 10, horizontal pull x 15  Dead bug variations : 04-19-22 added arms  Single arm stability with 6 lbs DB x 10 unilateral arm pull  Prone scapular protraction/retraction x 10 with foam roller to swan x 8  Sidelying L shoulder scaption 6 lbs then 4 lbs about 8 reps each, propped on Rt elbpw  Sidelying L shoulder ER about 1 min 4 lbs  Sidelying post delt fly 4 lbs x 30 sec  2 rounds of the last 3 exercises   [facility] Adult PT Treatment:                                                DATE: 10/10/21 Therapeutic Exercise: UBE 5 min L2  Blue band horizontal abd blue and then star patterns x 15 each  ER/IR x 20 blue band  Bicep curls 15 lbs x 1 min Bent over row 15 lbs x 15  Single arm reverse fly red loop x 15  Abduction single arm red loop x 15  Sustained ER with shoulder flexion red loop x 10 (sidestepping)  Blue band ER with shoulder at 90 deg flexion x 10 Blue band ER with abduction 90 deg x 10  Overhead press 10 lbs double arm x 10  Front plank with leg lifts  Mod push up on high mat- pain , also with knees on mat table  Sideplank on L elbow  hold then added hip dips x 10 Pilates upstretch 1 red 1 yellow (plank) with press out x 10 no pain .     [facility] Adult PT Treatment:                                                DATE: 10/05/21 Therapeutic Exercise: Pilates Reformer used for LE/core strength, postural strength, lumbopelvic disassociation and core control.  Exercises included: Footwork 2 red 1 blue 1 Yellow while taking subjective and prep for session  Supine Arm work  1 red 1 blue: Arcs in parallel and in T with ball held in table top  Supine Abs   1 red 1 blue upper ab curl x 10 , 2 sets , 2nd set with legs extension     Quadruped  Moving plank on platform x 10 each , 2 sets, 1 red than 1  blue for challenge in core   Reverse Abdominals  1 blue x 10 UE only then added LE x 10   Long box Prone Pulling straps 1 red x 10  Triceps x 10  Overhead press 1 x 15 double arm then x 10 single arm Clicking noted L shoulder (clavicle) full ROM   Weighted carry 5 lbs KB , bell up elbow flexed arm up then arm out to 90 deg abd  Lateral and forward raise 5 lbs x 8 each stand in mirror     [facility] Adult PT Treatment:                                                DATE: 09/28/21 Therapeutic Exercise: UBE 5 min , 2.5 min each direction  Foam roller thoracic extension Foam roller core work, stabilization  Alternating arm flexion/extension, horizontal pull green band, diagonal pull green band, green looped band overhead lift Dead bug variations Sidelying post delt fly x 15 , 4 lbs , 2 sets Sidelying ER , 4 lbs x 15 , 2 sets  Sidelying abduction x 15 , 4 lbs  2 sets  Quadruped 3 lbs  shoulder flexion with opp leg extended each side x 10 small ROM  Bent over row 10 lbs knee on table x 10, then 15 lbs x 10 LUE only      [facility] Adult PT Treatment:                                                DATE: 09/26/21 Therapeutic Exercise: Foam roller on the wall: overhead mobility, star pattern and horizontal pull with green band x 20 reps each Looped band overhead lift x 10 then chest press (scapular stabilizers) x 10  Facing wall, rolling up and down for lat release and serratus   Reformer Quadruped 1 red press out to plank x 10 , min cues for alignment in head neck and shoulders  Row 1 red roll down  x 10  Horizontal abd 1 red  x 10  ER/IR 1 red spring x 15  Supine arm arcs 1 red 1 yellow x 10 in parallel and then in abduction  [place] Adult PT Treatment:                                                DATE: 09/23/21 Therapeutic Exercise: UBE level 1 , 5 min (2.5 min each direction)  Standing bands (green and black) -progressed to Free motion 20# low row 15 x 2  Free motion ext 14#  bil shoulder ext  15 x 2  Free Motion high row 6# 15 x 2  Standing horizontal abduction with star Pattern 7 reps x 2  Bilat shoulder ER green band x 30, x 25 Sidelying post delt fly x 15 , 4 lbs  Sidelying ER , 4 lbs x 15  Sidelying abduction x 15 , 4 lbs  Sleeper stretch each side 10 sec x 5  POE plank 10 sec      PATIENT EDUCATION: Education details: HEP, eval findings, PT process Person educated: Patient Education method: Explanation, Demonstration, Verbal cues, and Handouts Education comprehension: verbalized understanding and needs further education     HOME EXERCISE PROGRAM: Access Code: [email or web address] URL: [email or web address] Date: 09/20/2021 Prepared by: Raeford Razor  Exercises - Sleeper Stretch  - 1 x daily - 7 x weekly - 1 sets - 10 reps - 15 hold - Shoulder External Rotation and Scapular Retraction with Resistance  - 2 x daily - 7 x weekly - 3 sets - 10 reps - Standing Low Trap Setting with Resistance at [place] 2 x daily - 7 x weekly - 2 sets - 10 reps - 5 hold - Seated High Shoulder Row with Anchored Resistance  - 2 x daily - 7 x weekly - 2 sets - 10 reps - 5 hold - Sidelying Shoulder ER with Towel and Dumbbell  - 1 x daily - 7 x weekly - 2 sets - 10 reps - 5 hold - Sidelying Shoulder Abduction Palm Forward  - 1 x daily - 7 x weekly - 2 sets - 10 reps - 30 hold  ASSESSMENT:   CLINICAL IMPRESSION: Pam unfortunately had some pain in L shoulder since the last visit.  Pain has been ongoing and intermittent with random , non exercise movements.  She was able to take anti-inflammatories and reduce her pain .  She has not been lifting her bike or riding as usual.  Mild pain post session 1/10-2/10.  She plans to focus on these exercises to isolate shoulder and improve muscle strength and endurance.  She has about 8moe visits.   OBJECTIVE IMPAIRMENTS decreased strength, increased fascial restrictions, impaired flexibility, impaired UE functional use, postural  dysfunction, and pain.    ACTIVITY LIMITATIONS carrying, lifting, sleeping, and reach over head   PARTICIPATION LIMITATIONS: community activity and occupation   PERSONAL FACTORS Time since onset of injury/illness/exacerbation are also affecting patient's functional outcome.    REHAB POTENTIAL: Excellent   CLINICAL DECISION MAKING: Stable/uncomplicated   EVALUATION COMPLEXITY: Low     GOALS: Goals reviewed with patient? Yes   SHORT TERM GOALS: Target date: 10/11/2021  (Remove Blue Hyperlink)   Pt will be I with HEP for bilateral UE strength and mobility  Baseline:given on eval  Goal status: met    2.  Pt will be able to sleep/lie on L shoulder without disturbance of pain for short periods of time  Baseline:  Status: 09/23/21: pain has not been waking her this week  Goal status: met   3.  Pt will lift light items (< 25 lbs) overhead (mimicking bike lift) with no pain to impact long term UE function  Baseline:  Goal status: ONGOING       LONG TERM GOALS: Target date: 11/08/2021  (Remove Blue Hyperlink)   Pt will be able to improve FOTO score to 70% or better  Baseline: 59%, update 72% goal is 73% Goal status:MET   2.  Pt will be able to lift bike onto her rack with confidence and no pain increase > 75% of the time  Baseline: will see today!  Goal status: ongoing   3.  Pt will be able to perform a modified push up (on knees) without increased shoulder pain  Baseline: popping, pain with full push up . Update: pain with 1 push up today with 2 modifcations.  Goal status:ongoing    4.  Pt will be I with final HEP upon discharge  Baseline:  Goal status: ongoing        PLAN: PT FREQUENCY: 2x/week x 3 weeks then 1 x for 3 more weeks    PT DURATION: 6 weeks    PLANNED INTERVENTIONS: Therapeutic exercises, Therapeutic activity, Neuromuscular re-education, Balance training, Gait training, Patient/Family education, Self Care, Joint mobilization, Dry Needling, Cryotherapy,  Moist heat, Taping, Ultrasound, Ionotophoresis 76m/ml Dexamethasone, Manual therapy, and Re-evaluation   PLAN FOR NEXT SESSION:  Check scap in closed chain: wall push ups. Sidelying Modalities as needed   Try some load carries, lifting overhead?  Assisted push up.    JRaeford Razor PT 10/17/21 9:53 AM Phone: [phone]: [phone]

## 2021-10-24 NOTE — Therapy (Signed)
OUTPATIENT PHYSICAL THERAPY TREATMENT NOTE   Patient Name: Sheena Gonzales MRN: [id number] DOB:[birth year], 51 y.o., female Today's Date: 10/25/2021  PCP/REFERRING PROVIDER: Rachell Cipro, MD  END OF SESSION:   PT End of Session - 10/25/21 0849     Visit Number 9    Number of Visits 12    Date for PT Re-Evaluation 11/08/21    Authorization Type UHC    PT Start Time [id number]    PT Stop Time 0930    PT Time Calculation (min) 44 min    Activity Tolerance Patient tolerated treatment well    Behavior During Therapy [facility] for tasks assessed/performed                  Past Medical History:  Diagnosis Date   Allergy    Anxiety    Substance abuse ([place]) 2016   AA   Past Surgical History:  Procedure Laterality Date   ablation  2006   uterine   COLONOSCOPY     FOOT SURGERY Right 1991   Patient Active Problem List   Diagnosis Date Noted   Substance abuse ([place]) 06/24/2020    REFERRING DIAG: Shoulder pain   THERAPY DIAG:  Chronic left shoulder pain  Abnormal posture  Rationale for Evaluation and Treatment Rehabilitation  PERTINENT HISTORY: see above   PRECAUTIONS: none   SUBJECTIVE:  No pain right now.  Sometimes the weirdest things hurt my arm. Bringing my arm down from folding laundry.   PAIN:  Are you having pain? Yes: NPRS scale: 0/10 Pain location: L shoulder  Pain description: sore Aggravating factors: reaching back  Relieving factors: rest  Pain mostly when reaching back  OBJECTIVE: (objective measures completed at initial evaluation unless otherwise dated)  DIAGNOSTIC FINDINGS:  IMPRESSION: 1. Moderate rotator cuff tendinopathy/tendinosis most notably involving the supraspinatus tendon. There are partial-thickness articular and bursal surface tears as detailed above. No full-thickness retracted tear. 2. Intact long head biceps tendon and glenoid labrum. 3. Moderate AC joint degenerative changes but no other significant findings for bony  impingement. 4. Mild subacromial/subdeltoid bursitis.       PATIENT SURVEYS:  FOTO 59% goal is 73 %  FOTO update: 10/10/21: 72%   COGNITION:           Overall cognitive status: Within functional limits for tasks assessed                                  SENSATION: WFL   POSTURE: Slightly rounded shoulders with glenohumeral head more anterior L shoulder with goal post position on the wall.  Scapula abducted from midline on L side. Bilateral she has anterior seated humeral head.    UPPER EXTREMITY ROM:                           Rt UE was more stiff than Lt. She fell on Rt UE awhile ago but no pain in Rt UE     Active ROM Right eval Left eval  Shoulder flexion [facility] WFL, 160 no pain   Shoulder extension      Shoulder abduction WFL WFL,  160 no pain   Shoulder adduction      Shoulder internal rotation FR to T8 FR to T6  Shoulder external rotation FR to T1 FR to T2  Elbow flexion      Elbow extension      Wrist flexion  Wrist extension      Wrist ulnar deviation      Wrist radial deviation      Wrist pronation      Wrist supination      (Blank rows = not tested)   UPPER EXTREMITY MMT:   MMT Right eval Left eval  Shoulder flexion 5 5  Shoulder extension      Shoulder abduction 5 5  Shoulder adduction      Shoulder internal rotation 5 5  Shoulder external rotation 5 4+  Middle trapezius 4+ 4  Lower trapezius      Elbow flexion      Elbow extension      Wrist flexion      Wrist extension      Wrist ulnar deviation      Wrist radial deviation      Wrist pronation      Wrist supination      Grip strength (lbs) 56 lbs 57.6 lbs  (Blank rows = not tested)   SHOULDER SPECIAL TESTS:            Impingement tests: Hawkins/Kennedy impingement test: negative and Painful arc test: negative            SLAP lesions:  NT            Instability tests: Apprehension test: negative             JOINT MOBILITY TESTING:  No stiffness in joint Post capsule tight L> R     [facility] Adult PT Treatment:                                                DATE: 10/25/21 Therapeutic Exercise: UBE for 5 min L3 Rotator cuff warm up with red looped long band  ER, ER with bilateral shoulder flexion, lower traps (elbows high with small ROM ER) Abduction and diagonal pull x 15 with green TB  Seated row 35 lbs x 12 each  TRX bicep curl TRX row BOSU forearm plank on toes, 30 sec x 2  BOSU kneeling plank with alt. Knee extension x 8 each side  BOSU forearm plank on high mat with hip extension x 8 each side  BOSU prone for upper back: scapular retraction with shoulder extension x 10, horizontal "T" x 10 BOSU prone goal post lift, reach and pull x 8  [facility] Adult PT Treatment:                                                DATE: 10/17/21 Therapeutic Exercise: UBE level 1: 3 min. forward, 3 min. back Supine foam roller core and mobility work: narrow grip overhead lift x 10, horizontal pull x 15  Dead bug variations : 20-Mar-2022 added arms  Single arm stability with 6 lbs DB x 10 unilateral arm pull  Prone scapular protraction/retraction x 10 with foam roller to swan x 8  Sidelying L shoulder scaption 6 lbs then 4 lbs about 8 reps each, propped on Rt elbpw  Sidelying L shoulder ER about 1 min 4 lbs  Sidelying post delt fly 4 lbs x 30 sec  2 rounds of the last 3 exercises   [facility] Adult PT Treatment:  DATE: 10/10/21 Therapeutic Exercise: UBE 5 min L2  Blue band horizontal abd blue and then star patterns x 15 each  ER/IR x 20 blue band  Bicep curls 15 lbs x 1 min Bent over row 15 lbs x 15  Single arm reverse fly red loop x 15  Abduction single arm red loop x 15  Sustained ER with shoulder flexion red loop x 10 (sidestepping)  Blue band ER with shoulder at 90 deg flexion x 10 Blue band ER with abduction 90 deg x 10  Overhead press 10 lbs double arm x 10  Front plank with leg lifts  Mod push up on high mat- pain , also with knees on  mat table  Sideplank on L elbow hold then added hip dips x 10 Pilates upstretch 1 red 1 yellow (plank) with press out x 10 no pain .     [facility] Adult PT Treatment:                                                DATE: 10/05/21 Therapeutic Exercise: Pilates Reformer used for LE/core strength, postural strength, lumbopelvic disassociation and core control.  Exercises included: Footwork 2 red 1 blue 1 Yellow while taking subjective and prep for session  Supine Arm work  1 red 1 blue: Arcs in parallel and in T with ball held in table top  Supine Abs   1 red 1 blue upper ab curl x 10 , 2 sets , 2nd set with legs extension     Quadruped  Moving plank on platform x 10 each , 2 sets, 1 red than 1 blue for challenge in core   Reverse Abdominals  1 blue x 10 UE only then added LE x 10   Long box Prone Pulling straps 1 red x 10  Triceps x 10  Overhead press 1 x 15 double arm then x 10 single arm Clicking noted L shoulder (clavicle) full ROM   Weighted carry 5 lbs KB , bell up elbow flexed arm up then arm out to 90 deg abd  Lateral and forward raise 5 lbs x 8 each stand in mirror        PATIENT EDUCATION: Education details: HEP, eval findings, PT process Person educated: Patient Education method: Explanation, Demonstration, Verbal cues, and Handouts Education comprehension: verbalized understanding and needs further education     HOME EXERCISE PROGRAM: Access Code: [email or web address] URL: [email or web address] Date: 09/20/2021 Prepared by: Raeford Razor  Exercises - Sleeper Stretch  - 1 x daily - 7 x weekly - 1 sets - 10 reps - 15 hold - Shoulder External Rotation and Scapular Retraction with Resistance  - 2 x daily - 7 x weekly - 3 sets - 10 reps - Standing Low Trap Setting with Resistance at [place]  - 2 x daily - 7 x weekly - 2 sets - 10 reps - 5 hold - Seated High Shoulder Row with Anchored Resistance  - 2 x daily - 7 x weekly - 2 sets - 10 reps - 5 hold - Sidelying Shoulder  ER with Towel and Dumbbell  - 1 x daily - 7 x weekly - 2 sets - 10 reps - 5 hold - Sidelying Shoulder Abduction Palm Forward  - 1 x daily - 7 x weekly - 2 sets - 10 reps - 30 hold  ASSESSMENT:  CLINICAL IMPRESSION: Pam reports no pain today, or in recent days.  She was able to do TRX exercises with L shoulder discomfort, noted difference in stability.  She still did not want to do push up variations.  Will see her 1-2 more times until POC is over for further reinforcement of principles and stability of L shoulder.   OBJECTIVE IMPAIRMENTS decreased strength, increased fascial restrictions, impaired flexibility, impaired UE functional use, postural dysfunction, and pain.    ACTIVITY LIMITATIONS carrying, lifting, sleeping, and reach over head   PARTICIPATION LIMITATIONS: community activity and occupation   PERSONAL FACTORS Time since onset of injury/illness/exacerbation are also affecting patient's functional outcome.    REHAB POTENTIAL: Excellent   CLINICAL DECISION MAKING: Stable/uncomplicated   EVALUATION COMPLEXITY: Low     GOALS: Goals reviewed with patient? Yes   SHORT TERM GOALS: Target date: 10/11/2021  (Remove Blue Hyperlink)   Pt will be I with HEP for bilateral UE strength and mobility  Baseline:given on eval  Goal status: met    2.  Pt will be able to sleep/lie on L shoulder without disturbance of pain for short periods of time  Baseline:  Status: 09/23/21: pain has not been waking her this week Goal status: met   3.  Pt will lift light items (< 25 lbs) overhead (mimicking bike lift) with no pain to impact long term UE function  Baseline:  Goal status: ONGOING       LONG TERM GOALS: Target date: 11/08/2021  (Remove Blue Hyperlink)   Pt will be able to improve FOTO score to 70% or better  Baseline: 59%, update 72% goal is 73% Goal status:MET   2.  Pt will be able to lift bike onto her rack with confidence and no pain increase > 75% of the time  Baseline: will  see today!  Goal status: ongoing   3.  Pt will be able to perform a modified push up (on knees) without increased shoulder pain  Baseline: popping, pain with full push up . Update: pain with 1 push up today with 2 modifcations.  Goal status:ongoing    4.  Pt will be I with final HEP upon discharge  Baseline:  Goal status: ongoing        PLAN: PT FREQUENCY: 2x/week x 3 weeks then 1 x for 3 more weeks    PT DURATION: 6 weeks    PLANNED INTERVENTIONS: Therapeutic exercises, Therapeutic activity, Neuromuscular re-education, Balance training, Gait training, Patient/Family education, Self Care, Joint mobilization, Dry Needling, Cryotherapy, Moist heat, Taping, Ultrasound, Ionotophoresis 62m/ml Dexamethasone, Manual therapy, and Re-evaluation   PLAN FOR NEXT SESSION:  Check scap in closed chain: wall push ups. Sidelying Modalities as needed   Try some load carries, lifting overhead?  Assisted push up.    JRaeford Razor PT 10/25/21 9:39 AM Phone: [phone]: [phone]

## 2021-10-25 ENCOUNTER — Encounter: Payer: Self-pay | Admitting: Physical Therapy

## 2021-10-25 ENCOUNTER — Ambulatory Visit: Payer: 59 | Admitting: Physical Therapy

## 2021-10-25 DIAGNOSIS — M25512 Pain in left shoulder: Secondary | ICD-10-CM | POA: Diagnosis not present

## 2021-10-25 DIAGNOSIS — G8929 Other chronic pain: Secondary | ICD-10-CM

## 2021-10-25 DIAGNOSIS — R293 Abnormal posture: Secondary | ICD-10-CM

## 2021-11-01 ENCOUNTER — Ambulatory Visit: Payer: 59 | Admitting: Physical Therapy

## 2021-11-01 ENCOUNTER — Encounter: Payer: Self-pay | Admitting: Physical Therapy

## 2021-11-01 DIAGNOSIS — R293 Abnormal posture: Secondary | ICD-10-CM

## 2021-11-01 DIAGNOSIS — M25512 Pain in left shoulder: Secondary | ICD-10-CM | POA: Diagnosis not present

## 2021-11-01 DIAGNOSIS — G8929 Other chronic pain: Secondary | ICD-10-CM

## 2021-11-01 NOTE — Therapy (Addendum)
OUTPATIENT PHYSICAL THERAPY TREATMENT NOTE DISCHARGE ADDENDED    Patient Name: Sheena Gonzales MRN: [id number] DOB:1970-05-13, 51 y.o., female Today's Date: 11/01/2021  PCP/REFERRING PROVIDER: Rachell Cipro, MD  END OF SESSION:   PT End of Session - 11/01/21 1225     Visit Number 10    Number of Visits 12    Date for PT Re-Evaluation 11/08/21    Authorization Type UHC    PT Start Time 1225    PT Stop Time 1423    PT Time Calculation (min) 50 min    Activity Tolerance Patient tolerated treatment well    Behavior During Therapy WFL for tasks assessed/performed                   Past Medical History:  Diagnosis Date   Allergy    Anxiety    Substance abuse ([place]) 2016   AA   Past Surgical History:  Procedure Laterality Date   ablation  2006   uterine   COLONOSCOPY     FOOT SURGERY Right 1991   Patient Active Problem List   Diagnosis Date Noted   Substance abuse ([place]) 06/24/2020    REFERRING DIAG: Shoulder pain   THERAPY DIAG:  Chronic left shoulder pain  Abnormal posture  Rationale for Evaluation and Treatment Rehabilitation  PERTINENT HISTORY: see above   PRECAUTIONS: none   SUBJECTIVE:  I was able to lift my bike and ride without shoulder pain.    PAIN:  Are you having pain? Yes: NPRS scale: 0/10 Pain location: L shoulder  Pain description: sore Aggravating factors: reaching back  Relieving factors: rest  Pain mostly when reaching back  OBJECTIVE: (objective measures completed at initial evaluation unless otherwise dated)  DIAGNOSTIC FINDINGS:   IMPRESSION: 1. Moderate rotator cuff tendinopathy/tendinosis most notably involving the supraspinatus tendon. There are partial-thickness articular and bursal surface tears as detailed above. No full-thickness retracted tear. 2. Intact long head biceps tendon and glenoid labrum. 3. Moderate AC joint degenerative changes but no other significant findings for bony impingement. 4. Mild  subacromial/subdeltoid bursitis.       PATIENT SURVEYS:  FOTO 59% goal is 73 %  FOTO update: 10/10/21: 72%   COGNITION:           Overall cognitive status: Within functional limits for tasks assessed                                  SENSATION: WFL   POSTURE: Slightly rounded shoulders with glenohumeral head more anterior L shoulder with goal post position on the wall.  Scapula abducted from midline on L side. Bilateral she has anterior seated humeral head.    UPPER EXTREMITY ROM:                           Rt UE was more stiff than Lt. She fell on Rt UE awhile ago but no pain in Rt UE     Active ROM Right eval Left eval  Shoulder flexion [facility] WFL, 160 no pain   Shoulder extension      Shoulder abduction WFL WFL,  160 no pain   Shoulder adduction      Shoulder internal rotation FR to T8 FR to T6  Shoulder external rotation FR to T1 FR to T2  Elbow flexion      Elbow extension      Wrist flexion  Wrist extension      Wrist ulnar deviation      Wrist radial deviation      Wrist pronation      Wrist supination      (Blank rows = not tested)   UPPER EXTREMITY MMT:   MMT Right eval Left eval  Shoulder flexion 5 5  Shoulder extension      Shoulder abduction 5 5  Shoulder adduction      Shoulder internal rotation 5 5  Shoulder external rotation 5 4+  Middle trapezius 4+ 4  Lower trapezius      Elbow flexion      Elbow extension      Wrist flexion      Wrist extension      Wrist ulnar deviation      Wrist radial deviation      Wrist pronation      Wrist supination      Grip strength (lbs) 56 lbs 57.6 lbs  (Blank rows = not tested)   SHOULDER SPECIAL TESTS:            Impingement tests: Hawkins/Kennedy impingement test: negative and Painful arc test: negative            SLAP lesions:  NT            Instability tests: Apprehension test: negative             JOINT MOBILITY TESTING:  No stiffness in joint Post capsule tight L> R    [facility] Adult PT  Treatment:                                                DATE: 11/01/21 Therapeutic Exercise: UBE level 1, 3 min forward and 3 min back  Supine green band horizontal pull , diagonals for warm up and overhead lift with looped band  Supine chest press  x 10 , 10 lbs , 2 sets  Supine narrow press x 10, 10 lbs , 2 sets Sidelying deltoid row 10 lbs x 15  High plank with alt. Arm lift , hands on table 90 deg flexion elbow bent , shoulder hgt  x 12  Goal post lift 6 lbs  x 10  Forward Lunge punch with slastix  x 8 each , alternating  Extension and row with springboard x 10  Diagonal pull single arm slastix  x 10  Triceps press quadruped x 10  Black looped band push up assist on wall  x 5    [facility] Adult PT Treatment:                                                DATE: 10/25/21 Therapeutic Exercise: UBE for 5 min L3 Rotator cuff warm up with red looped long band  ER, ER with bilateral shoulder flexion, lower traps (elbows high with small ROM ER) Abduction and diagonal pull x 15 with green TB  Seated row 35 lbs x 12 each  TRX bicep curl TRX row BOSU forearm plank on toes, 30 sec x 2  BOSU kneeling plank with alt. Knee extension x 8 each side  BOSU forearm plank on high mat with hip extension x 8 each side  BOSU prone  for upper back: scapular retraction with shoulder extension x 10, horizontal "T" x 10 BOSU prone goal post lift, reach and pull x 8  [facility] Adult PT Treatment:                                                DATE: 10/17/21 Therapeutic Exercise: UBE level 1: 3 min. forward, 3 min. back Supine foam roller core and mobility work: narrow grip overhead lift x 10, horizontal pull x 15  Dead bug variations : 04/07/2022 added arms  Single arm stability with 6 lbs DB x 10 unilateral arm pull  Prone scapular protraction/retraction x 10 with foam roller to swan x 8  Sidelying L shoulder scaption 6 lbs then 4 lbs about 8 reps each, propped on Rt elbpw  Sidelying L shoulder ER about 1 min 4  lbs  Sidelying post delt fly 4 lbs x 30 sec  2 rounds of the last 3 exercises   [facility] Adult PT Treatment:                                                DATE: 10/10/21 Therapeutic Exercise: UBE 5 min L2  Blue band horizontal abd blue and then star patterns x 15 each  ER/IR x 20 blue band  Bicep curls 15 lbs x 1 min Bent over row 15 lbs x 15  Single arm reverse fly red loop x 15  Abduction single arm red loop x 15  Sustained ER with shoulder flexion red loop x 10 (sidestepping)  Blue band ER with shoulder at 90 deg flexion x 10 Blue band ER with abduction 90 deg x 10  Overhead press 10 lbs double arm x 10  Front plank with leg lifts  Mod push up on high mat- pain , also with knees on mat table  Sideplank on L elbow hold then added hip dips x 10 Pilates upstretch 1 red 1 yellow (plank) with press out x 10 no pain .     [facility] Adult PT Treatment:                                                DATE: 10/05/21 Therapeutic Exercise: Pilates Reformer used for LE/core strength, postural strength, lumbopelvic disassociation and core control.  Exercises included: Footwork 2 red 1 blue 1 Yellow while taking subjective and prep for session  Supine Arm work  1 red 1 blue: Arcs in parallel and in T with ball held in table top  Supine Abs   1 red 1 blue upper ab curl x 10 , 2 sets , 2nd set with legs extension     Quadruped  Moving plank on platform x 10 each , 2 sets, 1 red than 1 blue for challenge in core   Reverse Abdominals  1 blue x 10 UE only then added LE x 10   Long box Prone Pulling straps 1 red x 10  Triceps x 10  Overhead press 1 x 15 double arm then x 10 single arm Clicking noted L shoulder (clavicle) full ROM   Weighted  carry 5 lbs KB , bell up elbow flexed arm up then arm out to 90 deg abd  Lateral and forward raise 5 lbs x 8 each stand in mirror        PATIENT EDUCATION: Education details: HEP, eval findings, PT process Person educated: Patient Education method:  Explanation, Demonstration, Verbal cues, and Handouts Education comprehension: verbalized understanding and needs further education     HOME EXERCISE PROGRAM: Access Code: [email or web address] URL: [email or web address] Date: 09/20/2021 Prepared by: Raeford Razor  Exercises - Sleeper Stretch  - 1 x daily - 7 x weekly - 1 sets - 10 reps - 15 hold - Shoulder External Rotation and Scapular Retraction with Resistance  - 2 x daily - 7 x weekly - 3 sets - 10 reps - Standing Low Trap Setting with Resistance at [place]  - 2 x daily - 7 x weekly - 2 sets - 10 reps - 5 hold - Seated High Shoulder Row with Anchored Resistance  - 2 x daily - 7 x weekly - 2 sets - 10 reps - 5 hold - Sidelying Shoulder ER with Towel and Dumbbell  - 1 x daily - 7 x weekly - 2 sets - 10 reps - 5 hold - Sidelying Shoulder Abduction Palm Forward  - 1 x daily - 7 x weekly - 2 sets - 10 reps - 30 hold  ASSESSMENT:   CLINICAL IMPRESSION: Pam was able to lift her bike and ride without increased pain this past weekend.  She was able to work on shoulder strength (emphasis on anterior ) without pain today.  Fatigued quickly with moderate weights, appropriate for load given.  Will finish POC and DC from PT next visit.     OBJECTIVE IMPAIRMENTS decreased strength, increased fascial restrictions, impaired flexibility, impaired UE functional use, postural dysfunction, and pain.    ACTIVITY LIMITATIONS carrying, lifting, sleeping, and reach over head   PARTICIPATION LIMITATIONS: community activity and occupation   PERSONAL FACTORS Time since onset of injury/illness/exacerbation are also affecting patient's functional outcome.    REHAB POTENTIAL: Excellent   CLINICAL DECISION MAKING: Stable/uncomplicated   EVALUATION COMPLEXITY: Low     GOALS: Goals reviewed with patient? Yes   SHORT TERM GOALS: Target date: 10/11/2021  (Remove Blue Hyperlink)   Pt will be I with HEP for bilateral UE strength and mobility   Baseline:given on eval  Goal status: met    2.  Pt will be able to sleep/lie on L shoulder without disturbance of pain for short periods of time  Baseline:  Status: 09/23/21: pain has not been waking her this week Goal status: met   3.  Pt will lift light items (< 25 lbs) overhead (mimicking bike lift) with no pain to impact long term UE function  Baseline:  Goal status: ONGOING       LONG TERM GOALS: Target date: 11/08/2021  (Remove Blue Hyperlink)   Pt will be able to improve FOTO score to 70% or better  Baseline: 59%, update 72% goal is 73% Goal status:MET   2.  Pt will be able to lift bike onto her rack with confidence and no pain increase > 75% of the time  Baseline: will see today!  Goal status: MET today    3.  Pt will be able to perform a modified push up (on knees) without increased shoulder pain  Baseline: popping, pain with full push up . Update: pain with 1 push up today with 2 modifcations.  Goal status: partially met  4.  Pt will be I with final HEP upon discharge  Baseline:  Goal status: MET        PLAN: PT FREQUENCY: 2x/week x 3 weeks then 1 x for 3 more weeks    PT DURATION: 6 weeks    PLANNED INTERVENTIONS: Therapeutic exercises, Therapeutic activity, Neuromuscular re-education, Balance training, Gait training, Patient/Family education, Self Care, Joint mobilization, Dry Needling, Cryotherapy, Moist heat, Taping, Ultrasound, Ionotophoresis 24m/ml Dexamethasone, Manual therapy, and Re-evaluation   PLAN FOR NEXT SESSION:  DC and FOTO and wrap it up! Check scap in closed chain: wall push ups. Sidelying. Try some load carries, lifting overhead?  Assisted push up.    JRaeford Razor PT 11/01/21 12:27 PM Phone: [phone]: [phone]    PHYSICAL THERAPY DISCHARGE SUMMARY  Visits from Start of Care: 10  Current functional level related to goals / functional outcomes: See above   Remaining deficits: Inability to perform full pushups  without pain crepitus    Education / Equipment: HEP, strengthening  , posture and core   Patient agrees to discharge. Patient goals were met. Patient is being discharged due to being pleased with the current functional level.  JRaeford Razor PT 11/07/21 3:18 PM Phone: [phone]: [phone]

## 2021-11-07 NOTE — Therapy (Deleted)
OUTPATIENT PHYSICAL THERAPY TREATMENT NOTE   Patient Name: Sheena Gonzales MRN: [id number] DOB:04/19/1970, 51 y.o., female Today's Date: 11/07/2021  PCP/REFERRING PROVIDER: Dewey, Elizabeth, MD  END OF SESSION:           Past Medical History:  Diagnosis Date   Allergy    Anxiety    Substance abuse (HCC) 2016   AA   Past Surgical History:  Procedure Laterality Date   ablation  2006   uterine   COLONOSCOPY     FOOT SURGERY Right 1991   Patient Active Problem List   Diagnosis Date Noted   Substance abuse (HCC) 06/24/2020    REFERRING DIAG: Shoulder pain   THERAPY DIAG:  No diagnosis found.  Rationale for Evaluation and Treatment Rehabilitation  PERTINENT HISTORY: see above   PRECAUTIONS: none   SUBJECTIVE:  I was able to lift my bike and ride without shoulder pain.    PAIN:  Are you having pain? Yes: NPRS scale: 0/10 Pain location: L shoulder  Pain description: sore Aggravating factors: reaching back  Relieving factors: rest  Pain mostly when reaching back  OBJECTIVE: (objective measures completed at initial evaluation unless otherwise dated)  DIAGNOSTIC FINDINGS:   IMPRESSION: 1. Moderate rotator cuff tendinopathy/tendinosis most notably involving the supraspinatus tendon. There are partial-thickness articular and bursal surface tears as detailed above. No full-thickness retracted tear. 2. Intact long head biceps tendon and glenoid labrum. 3. Moderate AC joint degenerative changes but no other significant findings for bony impingement. 4. Mild subacromial/subdeltoid bursitis.       PATIENT SURVEYS:  FOTO 59% goal is 73 %  FOTO update: 10/10/21: 72%   COGNITION:           Overall cognitive status: Within functional limits for tasks assessed                                  SENSATION: WFL   POSTURE: Slightly rounded shoulders with glenohumeral head more anterior L shoulder with goal post position on the wall.  Scapula abducted from  midline on L side. Bilateral she has anterior seated humeral head.    UPPER EXTREMITY ROM:                           Rt UE was more stiff than Lt. She fell on Rt UE awhile ago but no pain in Rt UE     Active ROM Right eval Left eval  Shoulder flexion WFL WFL, 160 no pain   Shoulder extension      Shoulder abduction WFL WFL,  160 no pain   Shoulder adduction      Shoulder internal rotation FR to T8 FR to T6  Shoulder external rotation FR to T1 FR to T2  Elbow flexion      Elbow extension      Wrist flexion      Wrist extension      Wrist ulnar deviation      Wrist radial deviation      Wrist pronation      Wrist supination      (Blank rows = not tested)   UPPER EXTREMITY MMT:   MMT Right eval Left eval  Shoulder flexion 5 5  Shoulder extension      Shoulder abduction 5 5  Shoulder adduction      Shoulder internal rotation 5 5  Shoulder external rotation 5 4+    Middle trapezius 4+ 4  Lower trapezius      Elbow flexion      Elbow extension      Wrist flexion      Wrist extension      Wrist ulnar deviation      Wrist radial deviation      Wrist pronation      Wrist supination      Grip strength (lbs) 56 lbs 57.6 lbs  (Blank rows = not tested)   SHOULDER SPECIAL TESTS:            Impingement tests: Hawkins/Kennedy impingement test: negative and Painful arc test: negative            SLAP lesions:  NT            Instability tests: Apprehension test: negative             JOINT MOBILITY TESTING:  No stiffness in joint Post capsule tight L> R    [facility] Adult PT Treatment:                                                DATE: 11/01/21 Therapeutic Exercise: UBE level 1, 3 min forward and 3 min back  Supine green band horizontal pull , diagonals for warm up and overhead lift with looped band  Supine chest press  x 10 , 10 lbs , 2 sets  Supine narrow press x 10, 10 lbs , 2 sets Sidelying deltoid row 10 lbs x 15  High plank with alt. Arm lift , hands on table 90 deg  flexion elbow bent , shoulder hgt  x 12  Goal post lift 6 lbs  x 10  Forward Lunge punch with slastix  x 8 each , alternating  Extension and row with springboard x 10  Diagonal pull single arm slastix  x 10  Triceps press quadruped x 10  Black looped band push up assist on wall  x 5    [facility] Adult PT Treatment:                                                DATE: 10/25/21 Therapeutic Exercise: UBE for 5 min L3 Rotator cuff warm up with red looped long band  ER, ER with bilateral shoulder flexion, lower traps (elbows high with small ROM ER) Abduction and diagonal pull x 15 with green TB  Seated row 35 lbs x 12 each  TRX bicep curl TRX row BOSU forearm plank on toes, 30 sec x 2  BOSU kneeling plank with alt. Knee extension x 8 each side  BOSU forearm plank on high mat with hip extension x 8 each side  BOSU prone for upper back: scapular retraction with shoulder extension x 10, horizontal "T" x 10 BOSU prone goal post lift, reach and pull x 8  [facility] Adult PT Treatment:                                                DATE: 10/17/21 Therapeutic Exercise: UBE level 1: 3 min. forward, 3 min. back Supine foam   roller core and mobility work: narrow grip overhead lift x 10, horizontal pull x 15  Dead bug variations : 04-21-22 added arms  Single arm stability with 6 lbs DB x 10 unilateral arm pull  Prone scapular protraction/retraction x 10 with foam roller to swan x 8  Sidelying L shoulder scaption 6 lbs then 4 lbs about 8 reps each, propped on Rt elbpw  Sidelying L shoulder ER about 1 min 4 lbs  Sidelying post delt fly 4 lbs x 30 sec  2 rounds of the last 3 exercises   [facility] Adult PT Treatment:                                                DATE: 10/10/21 Therapeutic Exercise: UBE 5 min L2  Blue band horizontal abd blue and then star patterns x 15 each  ER/IR x 20 blue band  Bicep curls 15 lbs x 1 min Bent over row 15 lbs x 15  Single arm reverse fly red loop x 15  Abduction single arm red  loop x 15  Sustained ER with shoulder flexion red loop x 10 (sidestepping)  Blue band ER with shoulder at 90 deg flexion x 10 Blue band ER with abduction 90 deg x 10  Overhead press 10 lbs double arm x 10  Front plank with leg lifts  Mod push up on high mat- pain , also with knees on mat table  Sideplank on L elbow hold then added hip dips x 10 Pilates upstretch 1 red 1 yellow (plank) with press out x 10 no pain .     [facility] Adult PT Treatment:                                                DATE: 10/05/21 Therapeutic Exercise: Pilates Reformer used for LE/core strength, postural strength, lumbopelvic disassociation and core control.  Exercises included: Footwork 2 red 1 blue 1 Yellow while taking subjective and prep for session  Supine Arm work  1 red 1 blue: Arcs in parallel and in T with ball held in table top  Supine Abs   1 red 1 blue upper ab curl x 10 , 2 sets , 2nd set with legs extension     Quadruped  Moving plank on platform x 10 each , 2 sets, 1 red than 1 blue for challenge in core   Reverse Abdominals  1 blue x 10 UE only then added LE x 10   Long box Prone Pulling straps 1 red x 10  Triceps x 10  Overhead press 1 x 15 double arm then x 10 single arm Clicking noted L shoulder (clavicle) full ROM   Weighted carry 5 lbs KB , bell up elbow flexed arm up then arm out to 90 deg abd  Lateral and forward raise 5 lbs x 8 each stand in mirror        PATIENT EDUCATION: Education details: HEP, eval findings, PT process Person educated: Patient Education method: Explanation, Demonstration, Verbal cues, and Handouts Education comprehension: verbalized understanding and needs further education     HOME EXERCISE PROGRAM: Access Code: [email or web address] URL: [email or web address] Date: 09/20/2021 Prepared by: Raeford Razor  Exercises - Sleeper Stretch  -  1 x daily - 7 x weekly - 1 sets - 10 reps - 15 hold - Shoulder External Rotation and Scapular Retraction with  Resistance  - 2 x daily - 7 x weekly - 3 sets - 10 reps - Standing Low Trap Setting with Resistance at Wall  - 2 x daily - 7 x weekly - 2 sets - 10 reps - 5 hold - Seated High Shoulder Row with Anchored Resistance  - 2 x daily - 7 x weekly - 2 sets - 10 reps - 5 hold - Sidelying Shoulder ER with Towel and Dumbbell  - 1 x daily - 7 x weekly - 2 sets - 10 reps - 5 hold - Sidelying Shoulder Abduction Palm Forward  - 1 x daily - 7 x weekly - 2 sets - 10 reps - 30 hold  ASSESSMENT:   CLINICAL IMPRESSION: Pam was able to lift her bike and ride without increased pain this past weekend.  She was able to work on shoulder strength (emphasis on anterior ) without pain today.  Fatigued quickly with moderate weights, appropriate for load given.  Will finish POC and DC from PT next visit.     OBJECTIVE IMPAIRMENTS decreased strength, increased fascial restrictions, impaired flexibility, impaired UE functional use, postural dysfunction, and pain.    ACTIVITY LIMITATIONS carrying, lifting, sleeping, and reach over head   PARTICIPATION LIMITATIONS: community activity and occupation   PERSONAL FACTORS Time since onset of injury/illness/exacerbation are also affecting patient's functional outcome.    REHAB POTENTIAL: Excellent   CLINICAL DECISION MAKING: Stable/uncomplicated   EVALUATION COMPLEXITY: Low     GOALS: Goals reviewed with patient? Yes   SHORT TERM GOALS: Target date: 10/11/2021  (Remove Blue Hyperlink)   Pt will be I with HEP for bilateral UE strength and mobility  Baseline:given on eval  Goal status: met    2.  Pt will be able to sleep/lie on L shoulder without disturbance of pain for short periods of time  Baseline:  Status: 09/23/21: pain has not been waking her this week Goal status: met   3.  Pt will lift light items (< 25 lbs) overhead (mimicking bike lift) with no pain to impact long term UE function  Baseline:  Goal status: ONGOING       LONG TERM GOALS: Target date:  11/08/2021  (Remove Blue Hyperlink)   Pt will be able to improve FOTO score to 70% or better  Baseline: 59%, update 72% goal is 73% Goal status:MET   2.  Pt will be able to lift bike onto her rack with confidence and no pain increase > 75% of the time  Baseline: will see today!  Goal status: MET today    3.  Pt will be able to perform a modified push up (on knees) without increased shoulder pain  Baseline: popping, pain with full push up . Update: pain with 1 push up today with 2 modifcations.  Goal status:ongoing    4.  Pt will be I with final HEP upon discharge  Baseline:  Goal status: ongoing        PLAN: PT FREQUENCY: 2x/week x 3 weeks then 1 x for 3 more weeks    PT DURATION: 6 weeks    PLANNED INTERVENTIONS: Therapeutic exercises, Therapeutic activity, Neuromuscular re-education, Balance training, Gait training, Patient/Family education, Self Care, Joint mobilization, Dry Needling, Cryotherapy, Moist heat, Taping, Ultrasound, Ionotophoresis 4mg/ml Dexamethasone, Manual therapy, and Re-evaluation   PLAN FOR NEXT SESSION:  DC and FOTO   and wrap it up! Check scap in closed chain: wall push ups. Sidelying. Try some load carries, lifting overhead?  Assisted push up.    Raeford Razor, PT 11/07/21 8:24 AM Phone: [phone] Fax: [phone]

## 2021-11-08 ENCOUNTER — Ambulatory Visit: Payer: 59 | Admitting: Physical Therapy
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
## 2022-01-03 ENCOUNTER — Ambulatory Visit: Payer: 59 | Admitting: Sports Medicine

## 2022-01-03 VITALS — BP 110/78 | Ht 64.0 in | Wt 155.0 lb

## 2022-01-03 DIAGNOSIS — G8929 Other chronic pain: Secondary | ICD-10-CM | POA: Diagnosis not present

## 2022-01-03 DIAGNOSIS — M75112 Incomplete rotator cuff tear or rupture of left shoulder, not specified as traumatic: Secondary | ICD-10-CM

## 2022-01-03 DIAGNOSIS — M19012 Primary osteoarthritis, left shoulder: Secondary | ICD-10-CM | POA: Diagnosis not present

## 2022-01-03 DIAGNOSIS — M25512 Pain in left shoulder: Secondary | ICD-10-CM | POA: Diagnosis not present

## 2022-01-03 NOTE — Patient Instructions (Addendum)
Good to see you Orthopedic surgery referral  As needed follow up  

## 2022-01-16 ENCOUNTER — Ambulatory Visit (INDEPENDENT_AMBULATORY_CARE_PROVIDER_SITE_OTHER): Payer: 59 | Admitting: Orthopedic Surgery

## 2022-01-16 ENCOUNTER — Encounter: Payer: Self-pay | Admitting: Orthopedic Surgery

## 2022-01-16 DIAGNOSIS — M19012 Primary osteoarthritis, left shoulder: Secondary | ICD-10-CM | POA: Diagnosis not present

## 2022-01-16 NOTE — Progress Notes (Signed)
Office Visit Note   Patient: Sheena Gonzales           Date of Birth: [birth year]           MRN: [id number] Visit Date: 01/16/2022 Requested by: Glennon Mac, [place],  [place] [id number] PCP: Fanny Bien, MD  Subjective: Chief Complaint  Patient presents with   Left Shoulder - Pain    HPI: Sheena Gonzales is a 52 y.o. female who presents to the office reporting left shoulder pain.  Pain ongoing for 6 months.  Does not recall specific injury.  She has tried an injection into the [facility] joint which did help for a week.  Also had a subacromial injection also under ultrasound guidance which helped less.  MRI scan from 723 is reviewed and it shows definite edema in the [facility] joint along with partial-thickness rotator cuff tearing.  Pain does wake the patient from sleep at night.  She localizes pain primarily to the superior aspect of the shoulder.  Driving irritates the shoulder.  Hard for her to do push-ups and she cannot lift her bike back onto the rack.  She works as a Forensic psychologist.  Has tried physical therapy also without too much relief..                ROS: All systems reviewed are negative as they relate to the chief complaint within the history of present illness.  Patient denies fevers or chills.  Assessment & Plan: Visit Diagnoses:  1. Arthritis of left acromioclavicular joint     Plan: Impression is likely left shoulder symptomatic AC joint arthritis.  Patient is somewhat relieved that she is not facing rotator cuff surgery.  She does have some tendinosis and partial-thickness tearing but I think her most symptomatic pain generator in the shoulder based on examination today as well as review of the MRI scan is the [facility] joint.  We talked about another injection.  She is going to try something topical and avoiding crossed arm adduction type activities.  She will come back if she wants to try another [facility] joint injection under ultrasound guidance.  Follow-Up  Instructions: No follow-ups on file.   Orders:  No orders of the defined types were placed in this encounter.  No orders of the defined types were placed in this encounter.     Procedures: No procedures performed   Clinical Data: No additional findings.  Objective: Vital Signs: There were no vitals taken for this visit.  Physical Exam:  Constitutional: Patient appears well-developed HEENT:  Head: Normocephalic Eyes:EOM are normal Neck: Normal range of motion Cardiovascular: Normal rate Pulmonary/chest: Effort normal Neurologic: Patient is alert Skin: Skin is warm Psychiatric: Patient has normal mood and affect  Ortho Exam: Ortho exam demonstrates good cervical spine range of motion.  5 out of 5 grip EPL FPL interosseous resection extension bicep tricep deltoid strength.  Radial pulse intact bilaterally.  She does have discrete AC joint tenderness with crepitus on the left compared to the right.  Pain with crossarm adduction on the left compared to the right.  Has pretty smooth motion passively of the shoulder at 90 degrees of abduction with internal and external rotation.  Negative O'Brien's testing positive speeds testing on the left.  Passive range of motion both shoulders is 75/115/175.  Specialty Comments:  No specialty comments available.  Imaging: No results found.   PMFS History: Patient Active Problem List   Diagnosis Date Noted   Substance  abuse ([place]) 06/24/2020   Past Medical History:  Diagnosis Date   Allergy    Anxiety    Substance abuse ([place]) 2016   AA    Family History  Problem Relation Age of Onset   Stomach cancer Maternal Grandfather    Colon cancer Neg Hx    Colon polyps Neg Hx    Esophageal cancer Neg Hx    Rectal cancer Neg Hx     Past Surgical History:  Procedure Laterality Date   ablation  2006   uterine   COLONOSCOPY     FOOT SURGERY Right 1991   Social History   Occupational History   Not on file  Tobacco Use   Smoking  status: Never    Passive exposure: Past   Smokeless tobacco: Never  Vaping Use   Vaping Use: Never used  Substance and Sexual Activity   Alcohol use: Not Currently    Comment: quit drinking 2016   Drug use: Never   Sexual activity: Yes    Birth control/protection: Surgical

## 2022-02-14 NOTE — Progress Notes (Unsigned)
    Sheena Gonzales D.[place] Phone: [phone]   Assessment and Plan:     There are no diagnoses linked to this encounter.  ***   Pertinent previous records reviewed include ***   Follow Up: ***     Subjective:   I, Sheena Gonzales, am serving as a Education administrator for Doctor Sheena Gonzales  Chief Complaint: left heel pain   HPI:   02/15/2022 Patient is a 52 year old female complaining of left heel pain. Patient states  Relevant Historical Information: ***  Additional pertinent review of systems negative.   Current Outpatient Medications:    buPROPion (WELLBUTRIN XL) 150 MG 24 hr tablet, Take 1 tablet by mouth every morning., Disp: , Rfl:    FLUoxetine (PROZAC) 20 MG capsule, Take 20 mg by mouth daily., Disp: , Rfl:    ibuprofen (ADVIL) 400 MG tablet, Take 400 mg by mouth every 6 (six) hours as needed., Disp: , Rfl:    meloxicam (MOBIC) 15 MG tablet, Take 1 tablet (15 mg total) by mouth daily., Disp: 30 tablet, Rfl: 0  Current Facility-Administered Medications:    0.9 %  sodium chloride infusion, 500 mL, Intravenous, Once, [place], MD   Objective:     There were no vitals filed for this visit.    There is no height or weight on file to calculate BMI.    Physical Exam:    ***   Electronically signed by:  Sheena Gonzales D.[facility] 3:48 PM 02/14/22

## 2022-02-15 ENCOUNTER — Ambulatory Visit: Payer: 59 | Admitting: Sports Medicine

## 2022-02-15 VITALS — BP 102/78 | Ht 64.0 in | Wt 155.0 lb

## 2022-02-15 DIAGNOSIS — M7662 Achilles tendinitis, left leg: Secondary | ICD-10-CM | POA: Diagnosis not present

## 2022-02-15 DIAGNOSIS — M79672 Pain in left foot: Secondary | ICD-10-CM | POA: Diagnosis not present

## 2022-02-15 MED ORDER — MELOXICAM 15 MG PO TABS: 15.0000 mg | ORAL_TABLET | Freq: Every day | ORAL | 0 refills | Status: AC

## 2022-02-15 NOTE — Patient Instructions (Addendum)
Good to see you - Start meloxicam 15 mg daily x2 weeks.  If still having pain after 2 weeks, complete 3rd-week of meloxicam. May use remaining meloxicam as needed once daily for pain control.  Do not to use additional NSAIDs while taking meloxicam.  May use Tylenol [phone] mg 2 to 3 times a day for breakthrough pain. Boot use 2 weeks may gradually come out of boot weeks 3 and 4 as tolerated Ankle HEP 3-4 week follow up

## 2022-02-24 NOTE — Progress Notes (Signed)
    Sheena Gonzales D.[place] Phone: [phone]   Assessment and Plan:     There are no diagnoses linked to this encounter.  ***   Pertinent previous records reviewed include ***   Follow Up: ***     Subjective:   I, Sheena Gonzales, am serving as a Education administrator for Sheena Gonzales   Chief Complaint: left heel pain    HPI:    02/15/2022 Patient is a 52 year old female complaining of left heel pain. Patient states that she has had pain for a little while but she has been RICEs , really flared up in November so she rested completely , heel is just not heeling , when she went hiking the downhill was painful , started acupuncture and dry needling and its not responding to that , has taken motrin and aleve and that doesn't help, nom needles    02/28/2022 Patient states    Relevant Historical Information: None pertinent  Additional pertinent review of systems negative.   Current Outpatient Medications:    buPROPion (WELLBUTRIN XL) 150 MG 24 hr tablet, Take 1 tablet by mouth every morning., Disp: , Rfl:    FLUoxetine (PROZAC) 20 MG capsule, Take 20 mg by mouth daily., Disp: , Rfl:    ibuprofen (ADVIL) 400 MG tablet, Take 400 mg by mouth every 6 (six) hours as needed., Disp: , Rfl:    meloxicam (MOBIC) 15 MG tablet, Take 1 tablet (15 mg total) by mouth daily., Disp: 30 tablet, Rfl: 0  Current Facility-Administered Medications:    0.9 %  sodium chloride infusion, 500 mL, Intravenous, Once, [place], MD   Objective:     There were no vitals filed for this visit.    There is no height or weight on file to calculate BMI.    Physical Exam:    ***   Electronically signed by:  Sheena Gonzales D.[facility] 7:42 AM 02/24/22

## 2022-02-28 ENCOUNTER — Ambulatory Visit: Payer: 59 | Admitting: Sports Medicine

## 2022-02-28 VITALS — BP 102/80 | Ht 64.0 in

## 2022-02-28 DIAGNOSIS — M79672 Pain in left foot: Secondary | ICD-10-CM | POA: Diagnosis not present

## 2022-02-28 DIAGNOSIS — M7662 Achilles tendinitis, left leg: Secondary | ICD-10-CM | POA: Diagnosis not present

## 2022-03-15 ENCOUNTER — Other Ambulatory Visit: Payer: Self-pay | Admitting: Sports Medicine

## 2023-08-18 IMAGING — MG MM DIGITAL SCREENING BILAT W/ TOMO AND CAD
8 series · 9 of 24 positions shown · non-contrast
Comparison: Previous exam(s).

CLINICAL DATA: Screening.

EXAM:
DIGITAL SCREENING BILATERAL MAMMOGRAM WITH TOMOSYNTHESIS AND CAD
TECHNIQUE: Bilateral screening digital craniocaudal and mediolateral oblique
mammograms were obtained. Bilateral screening digital breast
tomosynthesis was performed. The images were evaluated with
computer-aided detection.

[L MLO synth-2D]
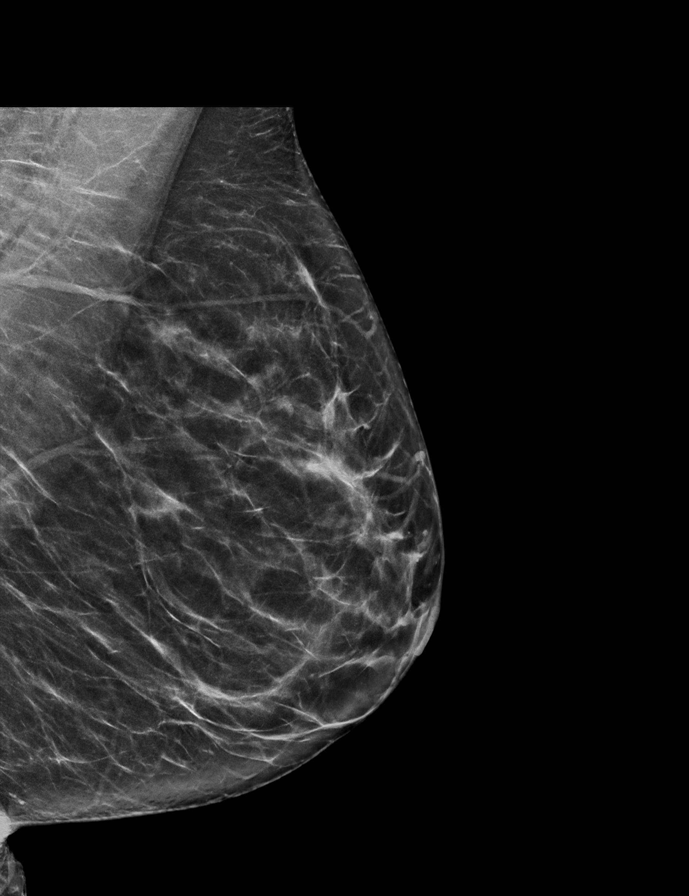

[R MLO synth-2D]
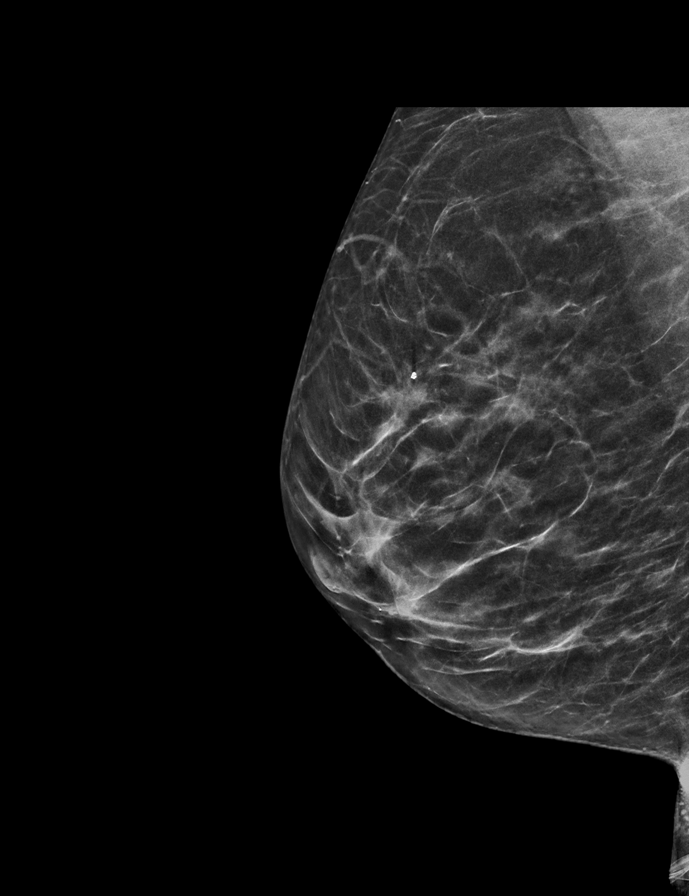

[L CC synth-2D]
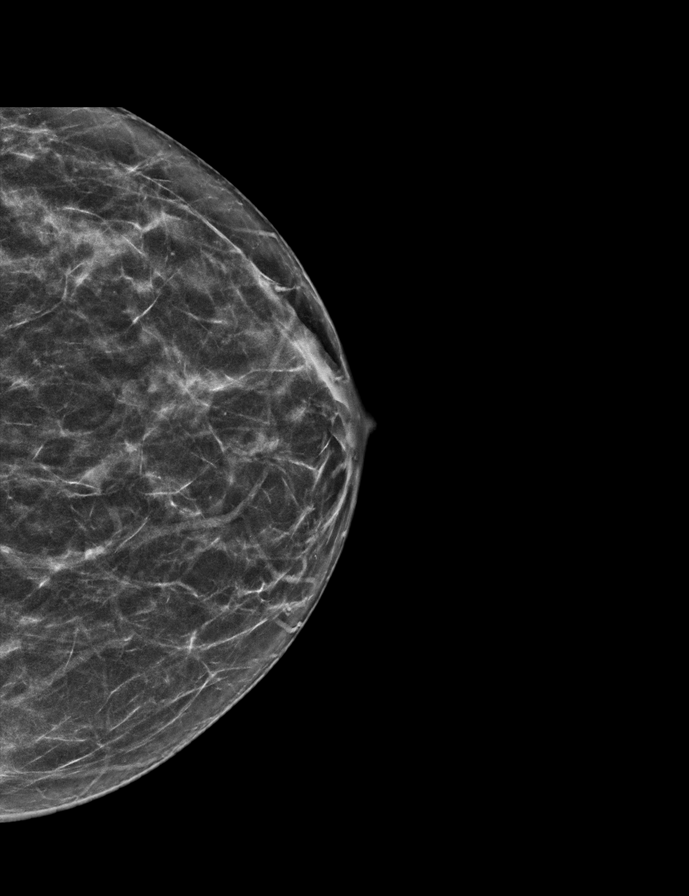

[R CC synth-2D]
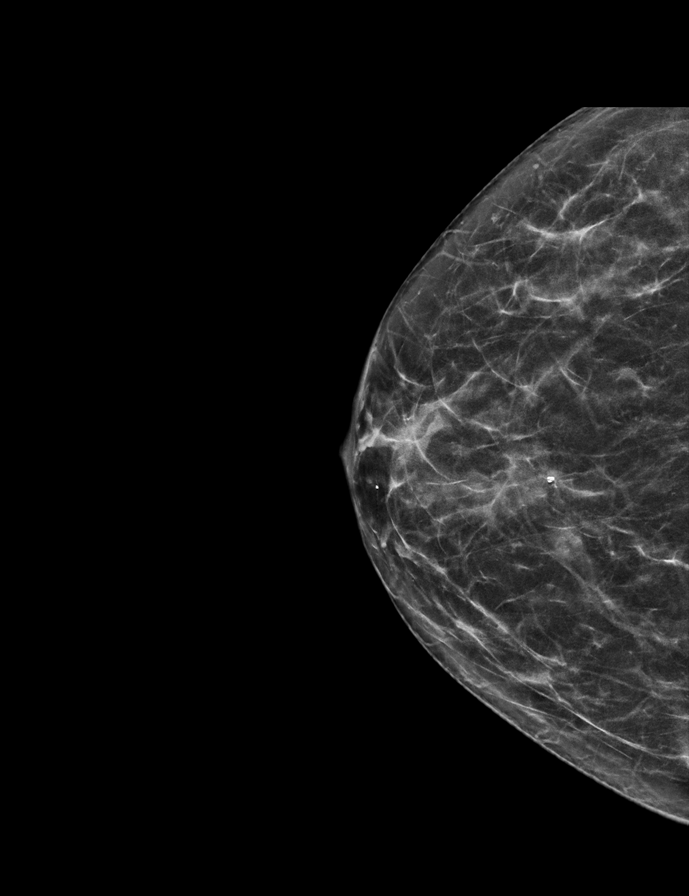

[R MLO tomo · 2 of 56 frames shown]
[frame 19/56]
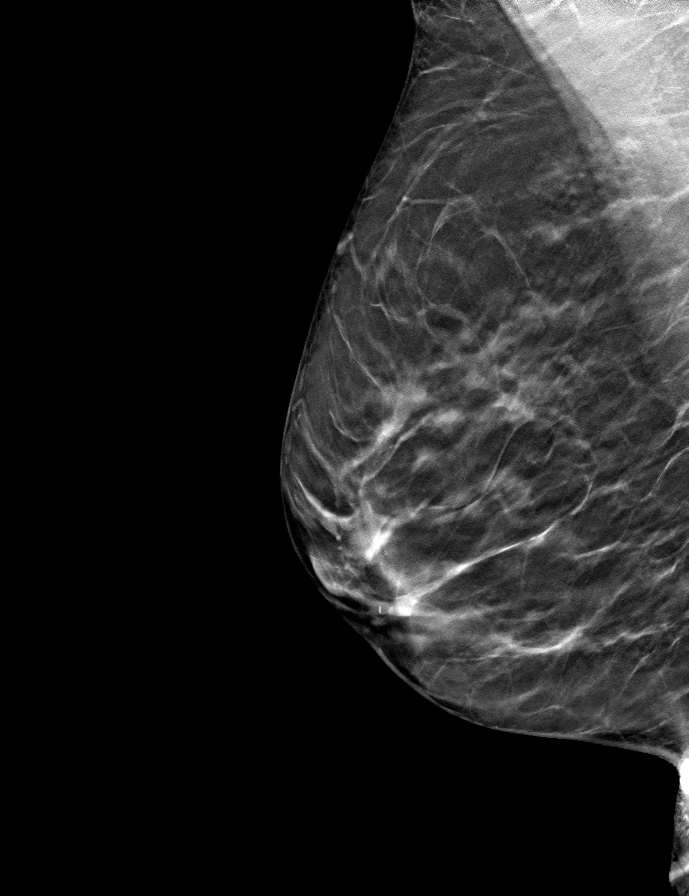
[frame 29/56]
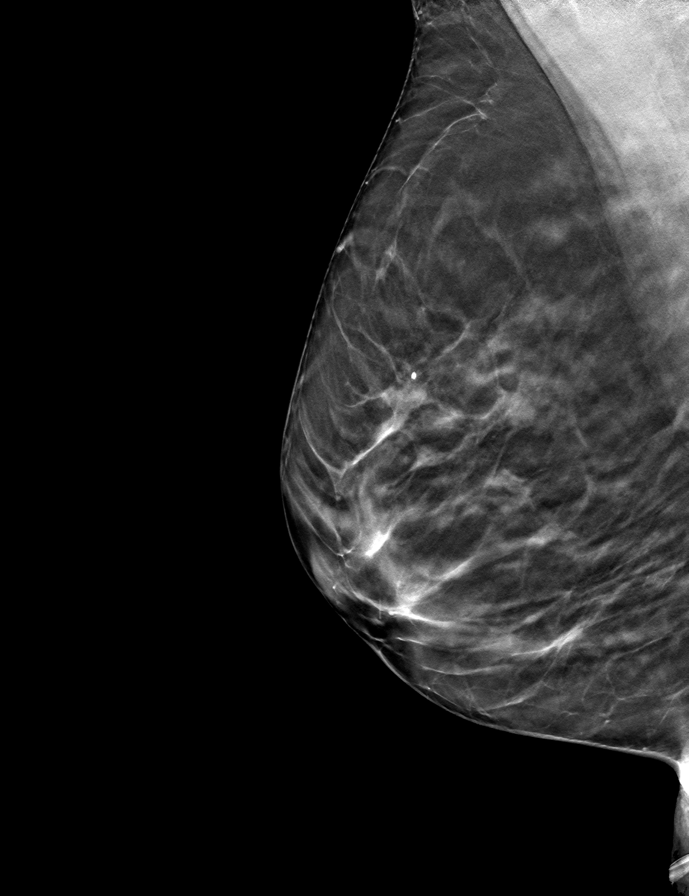

[L CC tomo · tomo slice 29/56.0]
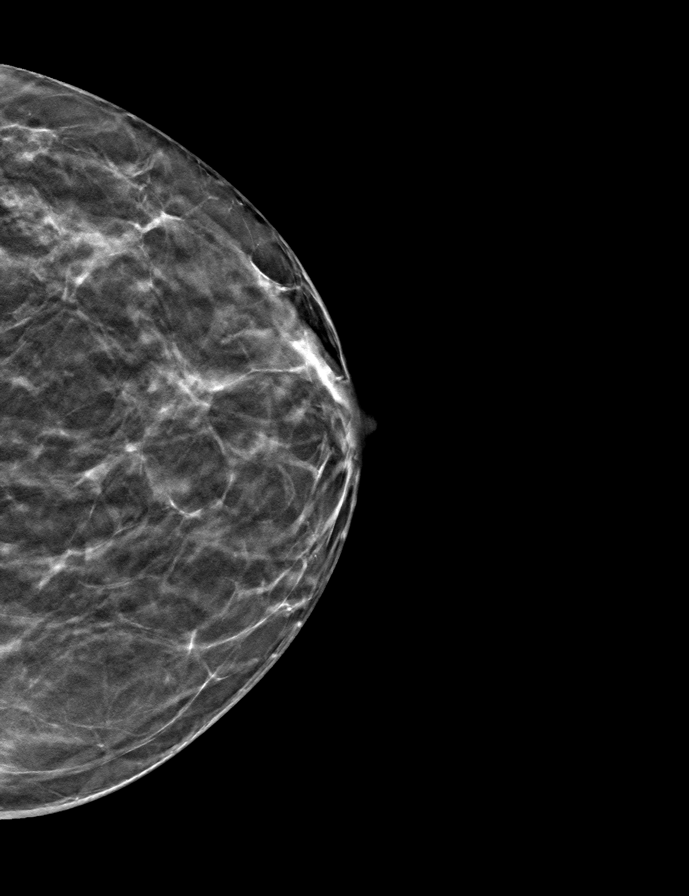

[R CC tomo · tomo slice 29/57.0]
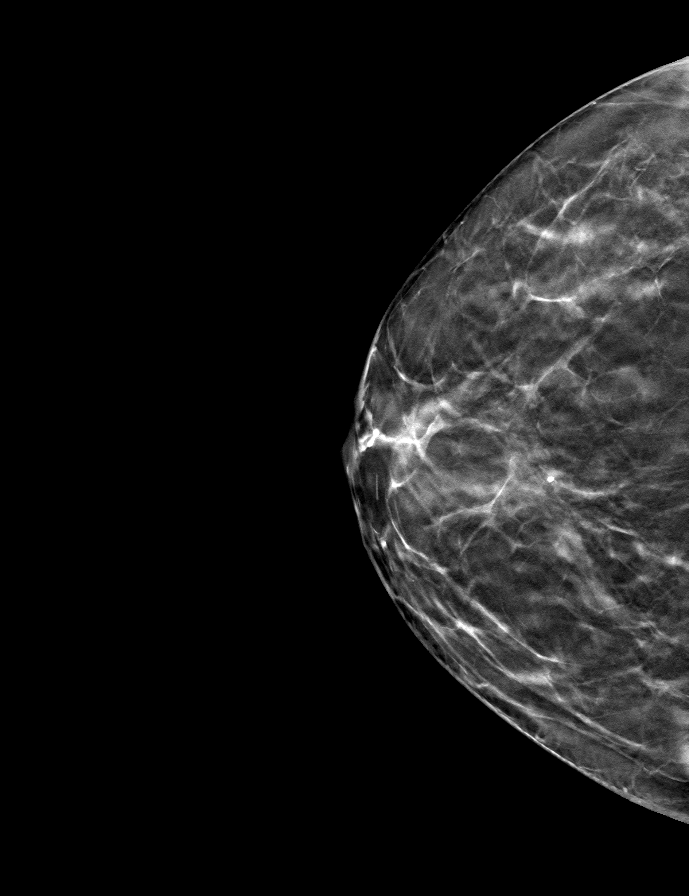

[L MLO tomo · tomo slice 33/66.0]
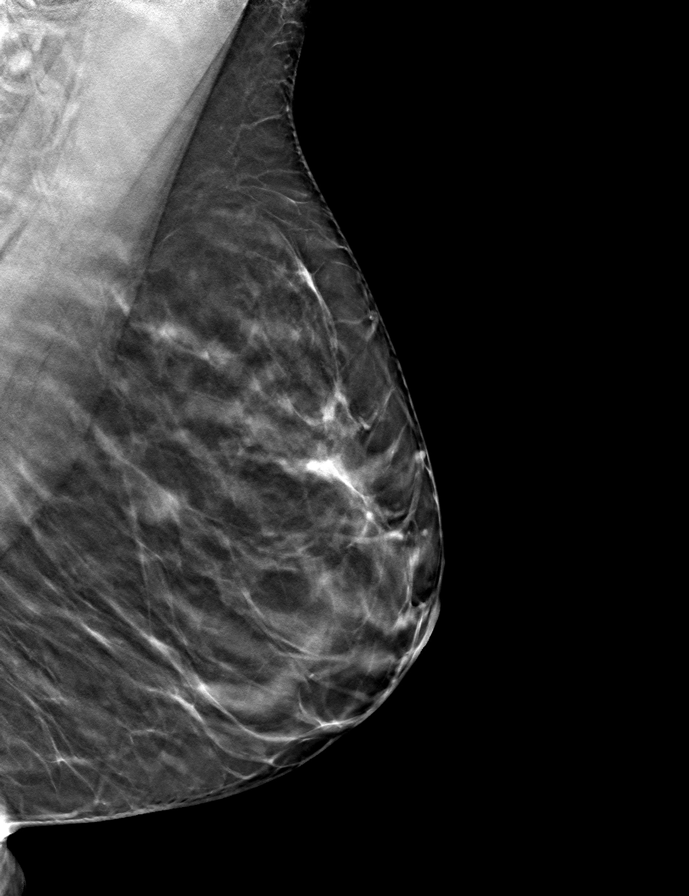

[9 of 24 positions shown; findings below may reference images not displayed]

ACR Breast Density Category b: There are scattered areas of
fibroglandular density.
FINDINGS: There are no findings suspicious for malignancy.
IMPRESSION: No mammographic evidence of malignancy. A result letter of this
screening mammogram will be mailed directly to the patient.

RECOMMENDATION:
Screening mammogram in one year. (Code:51-O-LD2)

BI-RADS CATEGORY  1: Negative.
